# Patient Record
Sex: Male | Born: 1985 | Race: White | Hispanic: No | Marital: Married | Smoking: Former smoker
Health system: Southern US, Community
[De-identification: ages and names within clinical notes are randomized; demographics above are authoritative.]

## PROBLEM LIST (undated history)

## (undated) ENCOUNTER — Emergency Department (HOSPITAL_COMMUNITY): Payer: Self-pay

## (undated) DIAGNOSIS — R569 Unspecified convulsions: Secondary | ICD-10-CM

## (undated) DIAGNOSIS — F1010 Alcohol abuse, uncomplicated: Secondary | ICD-10-CM

## (undated) HISTORY — PX: APPENDECTOMY: SHX54

## (undated) HISTORY — PX: EAR TUBE REMOVAL: SHX1486

## (undated) HISTORY — PX: OTHER SURGICAL HISTORY: SHX169

## (undated) HISTORY — PX: TYMPANOSTOMY TUBE PLACEMENT: SHX32

---
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
## 2004-08-02 ENCOUNTER — Emergency Department (HOSPITAL_COMMUNITY): Admission: EM | Admit: 2004-08-02 | Discharge: 2004-08-02 | Payer: Self-pay | Admitting: Emergency Medicine

## 2004-10-07 ENCOUNTER — Emergency Department (HOSPITAL_COMMUNITY): Admission: EM | Admit: 2004-10-07 | Discharge: 2004-10-07 | Payer: Self-pay | Admitting: *Deleted

## 2005-12-12 ENCOUNTER — Emergency Department (HOSPITAL_COMMUNITY): Admission: EM | Admit: 2005-12-12 | Discharge: 2005-12-12 | Payer: Self-pay | Admitting: Emergency Medicine

## 2006-02-19 ENCOUNTER — Emergency Department (HOSPITAL_COMMUNITY): Admission: EM | Admit: 2006-02-19 | Discharge: 2006-02-19 | Payer: Self-pay | Admitting: Family Medicine

## 2006-06-06 ENCOUNTER — Emergency Department (HOSPITAL_COMMUNITY): Admission: EM | Admit: 2006-06-06 | Discharge: 2006-06-06 | Payer: Self-pay | Admitting: Emergency Medicine

## 2006-07-01 ENCOUNTER — Emergency Department (HOSPITAL_COMMUNITY): Admission: EM | Admit: 2006-07-01 | Discharge: 2006-07-01 | Payer: Self-pay | Admitting: Family Medicine

## 2007-02-25 ENCOUNTER — Other Ambulatory Visit: Payer: Self-pay | Admitting: Emergency Medicine

## 2007-02-25 ENCOUNTER — Inpatient Hospital Stay (HOSPITAL_COMMUNITY): Admission: EM | Admit: 2007-02-25 | Discharge: 2007-02-28 | Payer: Self-pay | Admitting: Psychiatry

## 2007-02-25 ENCOUNTER — Ambulatory Visit: Payer: Self-pay | Admitting: Psychiatry

## 2007-03-03 ENCOUNTER — Other Ambulatory Visit (HOSPITAL_COMMUNITY): Admission: RE | Admit: 2007-03-03 | Discharge: 2007-03-24 | Payer: Self-pay | Admitting: Psychiatry

## 2007-09-09 ENCOUNTER — Emergency Department (HOSPITAL_COMMUNITY): Admission: EM | Admit: 2007-09-09 | Discharge: 2007-09-09 | Payer: Self-pay | Admitting: Emergency Medicine

## 2007-09-11 ENCOUNTER — Emergency Department (HOSPITAL_COMMUNITY): Admission: EM | Admit: 2007-09-11 | Discharge: 2007-09-12 | Payer: Self-pay | Admitting: *Deleted

## 2007-09-14 ENCOUNTER — Emergency Department (HOSPITAL_COMMUNITY): Admission: EM | Admit: 2007-09-14 | Discharge: 2007-09-14 | Payer: Self-pay | Admitting: Emergency Medicine

## 2008-07-02 ENCOUNTER — Emergency Department (HOSPITAL_COMMUNITY): Admission: EM | Admit: 2008-07-02 | Discharge: 2008-07-03 | Payer: Self-pay | Admitting: Emergency Medicine

## 2010-05-04 ENCOUNTER — Emergency Department (HOSPITAL_COMMUNITY)
Admission: EM | Admit: 2010-05-04 | Discharge: 2010-05-05 | Disposition: A | Discharge status: Other Acute Inpatient Hospital | Payer: Medicaid Other | Source: Home / Self Care | Attending: Emergency Medicine | Admitting: Emergency Medicine

## 2010-05-04 DIAGNOSIS — F1010 Alcohol abuse, uncomplicated: Secondary | ICD-10-CM | POA: Insufficient documentation

## 2010-05-04 DIAGNOSIS — F17200 Nicotine dependence, unspecified, uncomplicated: Secondary | ICD-10-CM | POA: Insufficient documentation

## 2010-05-04 LAB — CBC
HCT: 44.1 % (ref 39.0–52.0)
MCV: 100.5 fL — ABNORMAL HIGH (ref 78.0–100.0)
Platelets: 232 10*3/uL (ref 150–400)
RBC: 4.39 MIL/uL (ref 4.22–5.81)
RDW: 13.8 % (ref 11.5–15.5)

## 2010-05-04 LAB — DIFFERENTIAL
Eosinophils Absolute: 0.1 10*3/uL (ref 0.0–0.7)
Lymphocytes Relative: 43 % (ref 12–46)
Lymphs Abs: 3.2 10*3/uL (ref 0.7–4.0)
Monocytes Absolute: 0.6 10*3/uL (ref 0.1–1.0)
Monocytes Relative: 8 % (ref 3–12)
Neutro Abs: 3.4 10*3/uL (ref 1.7–7.7)

## 2010-05-04 LAB — BASIC METABOLIC PANEL
Creatinine, Ser: 0.87 mg/dL (ref 0.4–1.5)
GFR calc Af Amer: >60 mL/min (ref >60)
GFR calc non Af Amer: >60 mL/min (ref >60)
Glucose, Bld: 140 mg/dL — ABNORMAL HIGH (ref 70–99)
Potassium: 3.8 mEq/L (ref 3.5–5.1)

## 2010-05-05 ENCOUNTER — Inpatient Hospital Stay (HOSPITAL_COMMUNITY)
Admission: AD | Admit: 2010-05-05 | Discharge: 2010-05-08 | DRG: 897 | Disposition: A | Discharge status: Home / Self Care | Payer: Medicaid Other | Source: Ambulatory Visit | Attending: Psychiatry | Admitting: Psychiatry

## 2010-05-05 DIAGNOSIS — F10239 Alcohol dependence with withdrawal, unspecified: Secondary | ICD-10-CM

## 2010-05-05 DIAGNOSIS — F1020 Alcohol dependence, uncomplicated: Principal | ICD-10-CM

## 2010-05-05 DIAGNOSIS — F10939 Alcohol use, unspecified with withdrawal, unspecified: Secondary | ICD-10-CM

## 2010-05-05 DIAGNOSIS — Z6379 Other stressful life events affecting family and household: Secondary | ICD-10-CM

## 2010-05-05 LAB — HEPATIC FUNCTION PANEL
Albumin: 4 g/dL (ref 3.5–5.2)
Total Bilirubin: 2.3 mg/dL — ABNORMAL HIGH (ref 0.3–1.2)

## 2010-05-05 LAB — RAPID URINE DRUG SCREEN, HOSP PERFORMED
Amphetamines: NOT DETECTED
Opiates: NOT DETECTED

## 2010-05-06 DIAGNOSIS — F1020 Alcohol dependence, uncomplicated: Secondary | ICD-10-CM

## 2010-05-08 NOTE — H&P (Signed)
NAME:  Peter Waller, CASHER NO.:  [id number]  MEDICAL RECORD NO.:  [id number]           PATIENT TYPE:  I  LOCATION:  0303                          FACILITY:  BH  PHYSICIAN:  Anselm Jungling, MD  DATE OF BIRTH:  1985-09-20  DATE OF ADMISSION:  05/05/2010 DATE OF DISCHARGE:                      PSYCHIATRIC ADMISSION ASSESSMENT   HISTORY:  This is a voluntary admission to the services of Dr. [name].  This 25 year old married white male presented to the [facility] ED.  He requested alcohol detox and stated that his last drink had been earlier in the evening.  He did measure 207.  He states that he has been a heavy drinker for a number of years.  He states that after he was with [place] back in January to February 2009, he stayed clean for 6 months. He is currently unemployed.  He denies being depressed.  He has no issues with suicidal or homicidal ideation.  He has had no serious complications from alcohol withdrawal, but when he tries to stop on his own, he feels sick and he does have a hand tremor.  He denies any outright withdrawal seizures or DTs.  PAST PSYCHIATRIC HISTORY:  As stated, he was with [place] February 24, 2010 to February 27, 2010 and then March 03, 2007 to March 24, 2007.  He states that he was able to stay clean for 6 months after that and then he  relapsed.  He has been treated as an outpatient at the [facility], but has not been there in awhile.  He states he does best when he is in [place].  SOCIAL HISTORY:  He went to the ninth grade.  He never received his GED. He has been married 2 years.  They have a 16-month-old son.  His wife is currently 4 months pregnant.  He receives $658 a month SSI.  He reports that he was in the slow learner's group in school.  FAMILY HISTORY:  His paternal grandfather is alcoholic, actually the whole paternal side of his family drinks.  ALCOHOL/DRUG HISTORY:  He reports that he has been drinking a case of beer daily  since he relapsed.  He began using alcohol at age 25.  PRIMARY CARE PROVIDER:  He does not have one.  MEDICAL PROBLEMS:  He has no known medical illnesses.  He is not prescribed any medications and he denies any drug allergies.  PHYSICAL EXAMINATION:  GENERAL:  He was medically cleared in the ED at [facility]. VITAL SIGNS:  He was afebrile 98.3, pulse was 86, respirations 16, blood pressure was 133/83.  LABORATORY DATA:  His alcohol level was 207.  Other than that, he had no remarkable findings.  His liver functions, however, were not checked. He does have slight hand tremor today.  MENTAL STATUS EXAM:  He is alert and oriented.  He is appropriately groomed, dressed and nourished in his own clothing.  His speech is a little bit slow, but this may be secondary to his educational and IQ achievements.  His mood is all right.  He denies any active depression. Thought processes are clear, rational and goal oriented.  He wants to get clean and sober while his infant son will have no memory of this. Judgment and insight are fair.  Concentration and memory are intact. Intelligence is average to below.  He denies being suicidal or homicidal.  He denies auditory or visual hallucinations.  DIAGNOSES:  AXIS I:  Alcohol dependence, here for withdrawal. AXIS II:  Deferred. AXIS III:  None known. AXIS IV:  Economic issues. AXIS V:  48.  PLAN:  The plan is to admit for medically supported alcohol withdrawal through use of the Librium detox protocol.  When the patient was with [place] back in 2009, he was discharged on Campral.  He does not remember it being very effective.  He wants to be on as few meds as possible.  We will discuss the need for any other medications with Dr. Huston Foley later today.  Estimated length of stay is 3-5 days.     Mickie Leonarda Salon, P.A.-C.   ______________________________ Anselm Jungling, MD    MD/MEDQ  D:  05/06/2010  T:  05/06/2010  Job:   [id number]  Electronically Signed by Jaci Lazier ADAMS P.A.-C. on 05/07/2010 12:50:48 PM Electronically Signed by [name] MD on 05/08/2010 11:29:28 AM

## 2010-05-10 NOTE — Discharge Summary (Signed)
  NAME:  Peter Waller, Peter Waller NO.:  [id number]  MEDICAL RECORD NO.:  [id number]           PATIENT TYPE:  I  LOCATION:  0303                          FACILITY:  BH  PHYSICIAN:  Anselm Jungling, MD  DATE OF BIRTH:  [birth year]  DATE OF ADMISSION:  05/05/2010 DATE OF DISCHARGE:  05/08/2010                              DISCHARGE SUMMARY   IDENTIFYING DATA AND REASON FOR ADMISSION:  This was an inpatient psychiatric admission for Peter Waller, a 25 year old male, who presented for treatment of alcohol dependence.  This was his second [facility] admission.  Please refer to the Admission Note for further details pertaining to the symptoms, circumstances and history that led to his hospitalization.  He was given an initial Axis I diagnosis of alcohol dependence.  MEDICAL AND LABORATORY:  The patient was medically and physically assessed by the psychiatric nurse practitioner.  He was in good health without any active or chronic medical problems.  There were no significant medical issues outside of his alcohol detoxification.  HOSPITAL COURSE:  The patient was admitted to the Adult Inpatient Psychiatric Service.  He presented as a well-nourished, normally- developed adult male who was pleasant and cooperative.  He reported that he had previously been attending Alcoholics Anonymous, but had never gotten a sponsor.  He had not been working, but was married with a young child.  He expressed a strong desire for long-term sobriety and recovery.  He participated in therapeutic groups and activities, including those geared towards 12-step recovery.  He was a good participant throughout.  He was absent of suicidal ideation at all times during his stay.  At the time of discharge, the suicide risk assessment was completed, with a finding of minimal risk.  He appeared appropriate for discharge on the fourth hospital day.  He agreed to the following aftercare  plan.  AFTERCARE:  The patient declined any referrals to mental health or substance abuse treatment programs in the community.  However, he committed to attending 90 meetings in 90 days, and getting an AA sponsor.  He was discharged with two doses of Librium 25 to be taken at bedtime x2 nights, to complete his detoxification protocol.  DISCHARGE DIAGNOSES:  AXIS I:  Alcohol dependence, early remission. AXIS II:  Deferred. AXIS III:  No acute or chronic illnesses. AXIS IV:  Stressors, severe. AXIS V:  Global Assessment of Functioning on discharge 50.     Anselm Jungling, MD     SPB/MEDQ  D:  05/09/2010  T:  05/09/2010  Job:  [id number]  Electronically Signed by [name] MD on 05/10/2010 10:00:06 AM

## 2010-05-30 LAB — POCT I-STAT, CHEM 8
Glucose, Bld: 135 mg/dL — ABNORMAL HIGH (ref 70–99)
HCT: 51 % (ref 39.0–52.0)
Sodium: 137 mEq/L (ref 135–145)
TCO2: 29 mmol/L (ref 0–100)

## 2010-05-30 LAB — DIFFERENTIAL
Eosinophils Absolute: 0.1 10*3/uL (ref 0.0–0.7)
Eosinophils Relative: 1 % (ref 0–5)
Lymphocytes Relative: 29 % (ref 12–46)
Lymphs Abs: 2.2 10*3/uL (ref 0.7–4.0)
Monocytes Absolute: 0.7 10*3/uL (ref 0.1–1.0)
Monocytes Relative: 9 % (ref 3–12)

## 2010-05-30 LAB — URINALYSIS, ROUTINE W REFLEX MICROSCOPIC
Bilirubin Urine: NEGATIVE
Glucose, UA: NEGATIVE mg/dL
Ketones, ur: NEGATIVE mg/dL
Protein, ur: NEGATIVE mg/dL
Specific Gravity, Urine: 1.009 (ref 1.005–1.030)
pH: 6.5 (ref 5.0–8.0)

## 2010-05-30 LAB — CBC
HCT: 45.3 % (ref 39.0–52.0)
Platelets: 188 10*3/uL (ref 150–400)
RBC: 4.38 MIL/uL (ref 4.22–5.81)
RDW: 14 % (ref 11.5–15.5)

## 2010-05-30 LAB — RAPID URINE DRUG SCREEN, HOSP PERFORMED: Cocaine: NOT DETECTED

## 2010-07-04 NOTE — Discharge Summary (Signed)
NAME:  Peter Waller, Peter Waller NO.:  [id number]   MEDICAL RECORD NO.:  [id number]          PATIENT TYPE:  IPS   LOCATION:  0507                          FACILITY:  BH   PHYSICIAN:  Geoffery Lyons, M.D.      DATE OF BIRTH:  08/21/1985   DATE OF ADMISSION:  02/25/2007  DATE OF DISCHARGE:  02/28/2007                               DISCHARGE SUMMARY   CHIEF COMPLAINT AND PRESENT ILLNESS:  This was the first admission to  [facility] for this 25 year old male voluntarily  admitted.  History of alcohol abuse, drinking 1-2 cases of beer per day.  Endorsed that he started partying when he was 13, a couple of beers  when he was 15, started partying all the time by 17.  He was drinking a  case plus a day, got sick in the morning, started drinking early on.  Occasional use of oxy.  When he was 13 he tried cocaine, making him  paranoid, occasional use of marijuana.  Endorsed that he occasionally  feels depressed, wanting to die, was committed.  He was wanting to be  detoxed, was feeling very overwhelmed with the way things were going for  him.   PAST PSYCHIATRIC HISTORY:  First time at behavioral health.  No ongoing  treatment.  Has been deemed disabled.  Was assessed for learning  disabilities.   ALCOHOL AND DRUG HISTORY:  As already stated, started using alcohol and  built up to 2 cases per day with early morning drinking and withdrawal.  He is now drinking every day.  Occasional use of oxy and marijuana.  No  active use of cocaine.   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   SOCIAL HISTORY:  Endorsed that he grew up fighting due to the kids  picking on him due to the growth he had.  Then he developed deafness.  Did not do well in school, had to fight a lot, quit school when he was  in 10th grade.  Biological father on his side were afflicted by  alcoholism, stepfather also used to  drink, so he grew up around alcohol.  His siblings, alcohol and other addictions, jail.  He endorsed depressed  mood, decreased energy, decreased motivation, a sense of hopelessness  and helplessness.   We continued to detox with Librium.  He was willing to take Campral for  the cravings and we tried some trazodone for sleep.  He continued to be  focused on commitment to abstaining.  He was in a relationship with a  girlfriend who was supportive of him.  She was wanting to be with him  and get pregnant.  She was concerned about any possible effect of  alcohol on the baby if he was to continue to drink.  He was pretty  insightful.  He was committed to abstinence, however at the same time he  was worried about when he got out there he did not want to go back to  drinking as he had to let  go of the things he used to like to do before  like fishing, hunting, who sees how alcohol was taking over his life.  We worked on Pharmacologist.  He was able to open up and talk about some  of the traumatic events in his life.  January 9 he was in full contact  with reality and there were no active suicidal or homicidal ideas, no  hallucinations or delusions.  Willing and motivated to pursue further  outpatient treatment, was going to come to [facility]  I[facility].   DISCHARGE DIAGNOSES:  Axis I:  Alcohol dependence; depressive disorder,  not otherwise specified; rule out posttraumatic stress disorder.  Axis II:  No diagnosis.  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  Axis IV:  Moderate.  Axis V:  Upon discharge 50.   DISCHARGED ON:  1. Campral 333 mg 2-3 times a day.  2. Trazodone 100 mg 1/2 to 1 at night for sleep.  3. Librium 25, 1 in the morning x3 more days, then discontinue.   FOLLOWUP:  [facility] IOP.      Geoffery Lyons, M.D.  Electronically Signed     IL/MEDQ  D:  03/17/2007  T:  03/18/2007  Job:  [id number]

## 2010-07-07 NOTE — Op Note (Signed)
NAME:  Peter Waller, Peter Waller                       ACCOUNT NO.:  [id number]   MEDICAL RECORD NO.:  [id number]                   PATIENT TYPE:  INP   LOCATION:  6123                                 FACILITY:  [facility]   PHYSICIAN:  Leonia Corona, M.D.               DATE OF BIRTH:  01-07-1986   DATE OF PROCEDURE:  11/15/2001  DATE OF DISCHARGE:  11/16/2001                                 OPERATIVE REPORT   IDENTIFYING INFORMATION:  25 year old male child.   PREOPERATIVE DIAGNOSIS:  Acute appendicitis, possible perforation, possibly  perforation peritonitis.   POSTOPERATIVE DIAGNOSIS:  Acute suppurative appendicitis.   OPERATION/PROCEDURE:  Open appendectomy.   SURGEON:  Leonia Corona, M.D.   ASSISTANT:  Nurse.   ANESTHESIA:  General endotracheal anesthesia.   DESCRIPTION OF PROCEDURE:  The patient was brought into the operating room,  placed upon the operating table and general endotracheal anesthesia was  given.  The right lower quadrant of the abdominal wall and the surrounding  area is cleaned, prepped and draped in the usual manner.  A transverse  incision at the center of McBurney's point, measuring about 4-5 cm, is made  with a knife.  The incision is deepened into the subcutaneous tissue using  electrocautery and then the external aponeurosis was exposed.  Approximately  10 cc of 0.25% Marcaine with epinephrine was infiltrated into the  subcutaneous plane for postoperative pain control.  The extremities  aponeurosis was incised in the line of its fibers and then internal oblique  and transverse abdominis muscle then split along its fibers with the help of  a blunted hemostat and stretching with the blade of the retractor inserted  between the fibers until the peritoneum was exposed.  The peritoneum was  held up with push hemostats and incised in between as straw-colored fluid  exuded which was suctioned and cultures were obtained.  Aerobic and  anaerobic cultures were  obtained.  The opening in the peritoneum was  extended with the help of scissors.  The blade of the retractor was inserted  and the cecum was identified right beneath the incision with the help of  tenia coli.  It was held up with simple Babcock forceps and a very large,  tense, turgid appendix was immediately delivered without any difficulty.  It  had a large fecalith palpable.  It was tense and turgid with a very thick  remnant of mesoappendix.  The vessels running in the mesoappendix were  divided between clamps and ligated using 2-0 silk until the base of the  appendix was cleared.  The base of the appendix was inverted, clamped above.  It was transfix ligated using 2-0 Vicryl.  The appendix was divided below  the plane and above the ligator and removed from the field.  A pursestring  suture using 3-0 silk was made around the base of the appendix and it was  tied after inverting the base.  The  cecum was returned back into the  peritoneum cavity.  The anterior of the peritoneum was inspected for any  oozing or bleeding.  None was noted.  Thorough irrigation of the peritoneal  cavity with copious amounts of warm saline was done until a return of clear.  We now closed the peritoneum using 2-0 Vicryl running stitch.  The internal  transversus abdominis muscles were approximated using interrupted sutures  with 2-0 Vicryl.  The external oblique aponeurosis was repaired using 2-0  Vicryl interrupted sutures.  The wound was irrigated once again.  The skin  was closed in two layers, the subcutaneous using 4-0 Vicryl interrupted  sutures and the skin with 5-0 Vicryl subcuticular stitch.  Steri-  Strips were applied which were covered with sterile gauze and Tegaderm  dressing.  The patient tolerated the procedure very well which was smooth  and uneventful.  The patient was extubated and transported to the recovery  room in good and stable condition.                                                Leonia Corona, M.D.    SF/MEDQ  D:  11/15/2001  T:  11/17/2001  Job:  [id number]

## 2010-11-08 LAB — COMPREHENSIVE METABOLIC PANEL
Alkaline Phosphatase: 58
BUN: 6
Creatinine, Ser: 1.12
Potassium: 3.6
Sodium: 143
Total Protein: 8.2

## 2010-11-08 LAB — ETHANOL
Alcohol, Ethyl (B): 170 — ABNORMAL HIGH
Alcohol, Ethyl (B): 302 — ABNORMAL HIGH

## 2010-11-08 LAB — DIFFERENTIAL
Eosinophils Absolute: 0.1
Monocytes Absolute: 0.3
Neutro Abs: 4.5

## 2010-11-08 LAB — CBC
HCT: 47.5
MCHC: 34.9
Platelets: 299
RBC: 4.83
RDW: 12.4
WBC: 8.1

## 2010-11-08 LAB — RAPID URINE DRUG SCREEN, HOSP PERFORMED: Cocaine: NOT DETECTED

## 2010-11-09 LAB — URINE DRUGS OF ABUSE SCREEN W ALC, ROUTINE (REF LAB)
Amphetamine Screen, Ur: NEGATIVE
Cocaine Metabolites: NEGATIVE
Ethyl Alcohol: <5
Marijuana Metabolite: NEGATIVE
Methadone: NEGATIVE

## 2010-12-14 ENCOUNTER — Emergency Department (HOSPITAL_COMMUNITY)
Admission: EM | Admit: 2010-12-14 | Discharge: 2010-12-14 | Disposition: A | Discharge status: Home / Self Care | Payer: Medicaid Other | Attending: Emergency Medicine | Admitting: Emergency Medicine

## 2010-12-14 DIAGNOSIS — K5289 Other specified noninfective gastroenteritis and colitis: Secondary | ICD-10-CM | POA: Insufficient documentation

## 2010-12-14 DIAGNOSIS — R42 Dizziness and giddiness: Secondary | ICD-10-CM | POA: Insufficient documentation

## 2010-12-14 DIAGNOSIS — Z9889 Other specified postprocedural states: Secondary | ICD-10-CM | POA: Insufficient documentation

## 2010-12-14 DIAGNOSIS — R112 Nausea with vomiting, unspecified: Secondary | ICD-10-CM | POA: Insufficient documentation

## 2010-12-29 ENCOUNTER — Encounter: Payer: Self-pay | Admitting: Emergency Medicine

## 2010-12-29 ENCOUNTER — Emergency Department (HOSPITAL_COMMUNITY)
Admission: EM | Admit: 2010-12-29 | Discharge: 2010-12-29 | Discharge status: Against Medical Advice | Payer: Medicaid Other | Attending: Emergency Medicine | Admitting: Emergency Medicine

## 2010-12-29 ENCOUNTER — Inpatient Hospital Stay (HOSPITAL_COMMUNITY): Admission: AD | Admit: 2010-12-29 | Payer: Medicaid Other | Source: Ambulatory Visit | Admitting: Psychiatry

## 2010-12-29 DIAGNOSIS — F1010 Alcohol abuse, uncomplicated: Secondary | ICD-10-CM | POA: Insufficient documentation

## 2010-12-29 LAB — CBC
MCHC: 34.6 g/dL (ref 30.0–36.0)
Platelets: 184 10*3/uL (ref 150–400)
RDW: 14 % (ref 11.5–15.5)

## 2010-12-29 LAB — RAPID URINE DRUG SCREEN, HOSP PERFORMED
Barbiturates: NOT DETECTED
Cocaine: NOT DETECTED
Opiates: POSITIVE — AB

## 2010-12-29 LAB — COMPREHENSIVE METABOLIC PANEL
AST: 57 U/L — ABNORMAL HIGH (ref 0–37)
BUN: 7 mg/dL (ref 6–23)
CO2: 25 mEq/L (ref 19–32)
Chloride: 105 mEq/L (ref 96–112)
Creatinine, Ser: 0.75 mg/dL (ref 0.50–1.35)
GFR calc non Af Amer: >90 mL/min (ref >90)
Glucose, Bld: 70 mg/dL (ref 70–99)
Total Bilirubin: 0.3 mg/dL (ref 0.3–1.2)

## 2010-12-29 LAB — ETHANOL: Alcohol, Ethyl (B): 315 mg/dL — ABNORMAL HIGH (ref 0–11)

## 2010-12-29 MED ORDER — IBUPROFEN 200 MG PO TABS: 600.0000 mg | ORAL_TABLET | Freq: Three times a day (TID) | ORAL | Status: DC | PRN

## 2010-12-29 MED ORDER — LORAZEPAM 1 MG PO TABS: 1.0000 mg | ORAL_TABLET | Freq: Three times a day (TID) | ORAL | Status: DC | PRN

## 2010-12-29 MED ORDER — ACETAMINOPHEN 325 MG PO TABS: 650.0000 mg | ORAL_TABLET | ORAL | Status: DC | PRN

## 2010-12-29 MED ORDER — ONDANSETRON HCL 8 MG PO TABS: 4.0000 mg | ORAL_TABLET | Freq: Three times a day (TID) | ORAL | Status: DC | PRN

## 2010-12-29 NOTE — Progress Notes (Addendum)
This patient was accepted for inpatient treatment to Dr. Dan Humphreys at 1400 to bed 304.02.  All paperwork was completed and faxed to appropriate parties.  Dr. And nursing staff were notified and agreeable with transfer.  ITR faxed to [facility] since writer currently does not have access to [facility] webpage.     Wt Readings from Last 3 Encounters:  No data found for Wt   Temp Readings from Last 3 Encounters:  12/29/10 97.5 F (36.4 C) Oral   BP Readings from Last 3 Encounters:  12/29/10 103/66   Pulse Readings from Last 3 Encounters:  12/29/10 89

## 2010-12-29 NOTE — ED Notes (Signed)
Attempted to have pt wake up to void, unable to wake pt up, pt was unable to become aroused, pt opened eyes but will not keep eyes open.

## 2010-12-29 NOTE — ED Notes (Signed)
Patient resting,  ACT team at bedside.  Patient denies any s/sx of DT.  He was sleeping prior to ACT arrival

## 2010-12-29 NOTE — ED Notes (Signed)
PT. REQUESTING DETOX FOR ETOH ABUSE , LAST DRINK THIS EVENING , DENIES SUICIDAL IDEATION.

## 2010-12-29 NOTE — Progress Notes (Signed)
Spiritual Care  Initiated conversation with patient.  Patient is grieving many losses including, most recently, the loss of his 16 month old daughter.  Patient has a great deal of emotional pain and anger.  I provided emotional support and grief support for patient.  He has come voluntarily to do rehab because he wants a different kind of life for him and for his family.  Peter Waller [phone]

## 2010-12-29 NOTE — ED Provider Notes (Signed)
History     CSN: [id number] Arrival date & time: 12/29/2010  1:18 AM   First MD Initiated Contact with Patient 12/29/10 0129      Chief Complaint  Patient presents with  . Alcohol Intoxication    (Consider location/radiation/quality/duration/timing/severity/associated sxs/prior treatment) Patient is a 25 y.o. male presenting with intoxication. The history is provided by the patient. No language interpreter was used.  Alcohol Intoxication This is a recurrent problem. The current episode started 6 to 12 hours ago. The problem occurs constantly. The problem has not changed since onset.Pertinent negatives include no chest pain, no abdominal pain, no headaches and no shortness of breath. The symptoms are aggravated by nothing. The symptoms are relieved by nothing. He has tried nothing for the symptoms. The treatment provided no relief.  Drank several 40s last night and presents wanting detox.  No SI or HI no AH or VH  History reviewed. No pertinent past medical history.  History reviewed. No pertinent past surgical history.  No family history on file.  History  Substance Use Topics  . Smoking status: Current Everyday Smoker  . Smokeless tobacco: Not on file  . Alcohol Use: Yes     REQUESTING DETOX FOR ALCOHOL ABUSE DRINKS DAILY      Review of Systems  Constitutional: Negative for activity change.  HENT: Negative for facial swelling.   Eyes: Negative for discharge.  Respiratory: Negative for shortness of breath.   Cardiovascular: Negative for chest pain.  Gastrointestinal: Negative for abdominal pain.  Genitourinary: Negative for difficulty urinating.  Musculoskeletal: Negative for arthralgias.  Neurological: Negative for dizziness and headaches.  Hematological: Negative.   Psychiatric/Behavioral: Negative for hallucinations, confusion, dysphoric mood and agitation.    Allergies  Aloe  Home Medications  No current outpatient prescriptions on file.  BP 131/81  Pulse  119  Temp(Src) 98.8 F (37.1 C) (Oral)  Resp 20  SpO2 96%  Physical Exam  Constitutional: He is oriented to person, place, and time. He appears well-developed and well-nourished. No distress.  HENT:  Head: Normocephalic and atraumatic.  Mouth/Throat: Oropharynx is clear and moist.  Eyes: EOM are normal. Pupils are equal, round, and reactive to light. Right eye exhibits no discharge. Left eye exhibits no discharge.  Neck: Normal range of motion. Neck supple.  Cardiovascular: Normal rate and regular rhythm.   Pulmonary/Chest: Effort normal and breath sounds normal.  Abdominal: Soft. Bowel sounds are normal.  Musculoskeletal: Normal range of motion. He exhibits no edema.  Neurological: He is alert and oriented to person, place, and time. He displays normal reflexes.  Skin: Skin is warm and dry.  Psychiatric: He has a normal mood and affect.    ED Course  Procedures (including critical care time)   Labs Reviewed  ETHANOL  CBC  URINE RAPID DRUG SCREEN (HOSP PERFORMED)  COMPREHENSIVE METABOLIC PANEL   No results found.   No diagnosis found.    MDM  Awaiting labs will consult ACT when appropriate        Kamerin Grumbine K Dynasty Holquin-Rasch, MD 12/29/10 1478

## 2010-12-29 NOTE — Progress Notes (Signed)
Assessment Note   Peter Waller is an 25 y.o. male that presented to the emergency department intoxicated with a BAL or .315.  A new BAL has been ordered.  Pt has had previous treatment for same.  Pt denies SI, HI, or any active psychosis.  Pt is concerned about his court date on the 13th of November and reports that he would need to be discharged prior to then.  Pt is refusing treatment at [facility] "because it was not helpful or useful" for me and is requesting to be inpt at [facility].  Please run for possible inpatient detox.  Axis I: Alcohol Abuse Axis II: Narcissistic Personality traits Axis III: History reviewed. No pertinent past medical history. Axis IV: other psychosocial or environmental problems, problems related to legal system/crime, problems related to social environment and problems with primary support group Axis V: 31-40 impairment in reality testing  Past Medical History: History reviewed. No pertinent past medical history.  History reviewed. No pertinent past surgical history.  Family History: No family history on file.  Social History:  reports that he has been smoking.  He does not have any smokeless tobacco history on file. He reports that he drinks alcohol. His drug history not on file.  Allergies:  Allergies  Allergen Reactions  . Aloe Rash    Home Medications:  Medications Prior to Admission  Medication Dose Route Frequency Provider Last Rate Last Dose  . acetaminophen (TYLENOL) tablet 650 mg  650 mg Oral Q4H PRN April K Palumbo-Rasch, MD      . ibuprofen (ADVIL,MOTRIN) tablet 600 mg  600 mg Oral Q8H PRN April K Palumbo-Rasch, MD      . LORazepam (ATIVAN) tablet 1 mg  1 mg Oral Q8H PRN April K Palumbo-Rasch, MD      . ondansetron [facility]) tablet 4 mg  4 mg Oral Q8H PRN April K Palumbo-Rasch, MD       No current outpatient prescriptions on file as of 12/29/2010.    OB/GYN Status:  No LMP for male patient.  General Assessment Data Living Arrangements:  Relatives Can pt return to current living arrangement?: Yes Admission Status: Voluntary Is patient capable of signing voluntary admission?: Yes Transfer from: Acute Hospital Referral Source: MD  Risk to self Suicidal Ideation: No Suicidal Intent: No Is patient at risk for suicide?: No Suicidal Plan?: No Access to Means: No What has been your use of drugs/alcohol within the last 12 months?: daily Factors that decrease suicide risk: Sense of responsibility to family Family Suicide History: No Recent stressful life event(s): Loss (Comment);Other (Comment) (just lost daughter) Persecutory voices/beliefs?: No Depression: No Depression Symptoms: Feeling worthless/self pity Substance abuse history and/or treatment for substance abuse?: Yes Suicide prevention information given to non-admitted patients: Yes  Risk to Others Homicidal Ideation: No Thoughts of Harm to Others: No Current Homicidal Intent: No Current Homicidal Plan: No Access to Homicidal Means: No History of harm to others?: Yes Assessment of Violence: In distant past Does patient have access to weapons?: Yes (Comment) Criminal Charges Pending?: Yes Describe Pending Criminal Charges: won't divulge Does patient have a court date: Yes Court Date: 01/02/11  Mental Status Report Appear/Hygiene: Disheveled Eye Contact: Good Motor Activity: Unremarkable Speech: Loud Level of Consciousness: Drowsy Mood: Labile Affect: Labile Anxiety Level: Minimal Thought Processes: Relevant Judgement: Impaired Orientation: Person;Place;Situation Obsessive Compulsive Thoughts/Behaviors: Moderate  Cognitive Functioning Concentration: Decreased Memory: Recent Impaired;Remote Intact IQ: Average Insight: Poor Impulse Control: Poor Appetite: Fair Sleep: No Change Total Hours of Sleep: 5  Vegetative Symptoms: None  Prior Inpatient/Outpatient Therapy Prior Therapy: Outpatient Prior Therapy Dates: currently Prior Therapy  Facilty/Provider(s): [facility] Reason for Treatment: detox                     Additional Information 1:1 In Past 12 Months?: No CIRT Risk: No Elopement Risk: No Does patient have medical clearance?: Yes     Disposition:  Disposition Disposition of Patient: Referred to [facility] for inpatient detox) Patient referred to:  [facility])  On Site Evaluation by:   Reviewed with Physician:     Angelica Ran 12/29/2010 9:28 AM

## 2010-12-29 NOTE — ED Notes (Signed)
Patient here for request for detox from Alcohol.  Patient states that he drinks several 40's per day.  Patient's last intake of alcohol was this evening.  Patient is having some problems staying awake at this time, unsteady gait.

## 2011-03-11 ENCOUNTER — Emergency Department (HOSPITAL_COMMUNITY)
Admission: EM | Admit: 2011-03-11 | Discharge: 2011-03-11 | Disposition: A | Discharge status: Home / Self Care | Payer: Medicaid Other | Attending: Emergency Medicine | Admitting: Emergency Medicine

## 2011-03-11 ENCOUNTER — Encounter (HOSPITAL_COMMUNITY): Payer: Self-pay | Admitting: Emergency Medicine

## 2011-03-11 DIAGNOSIS — F17200 Nicotine dependence, unspecified, uncomplicated: Secondary | ICD-10-CM | POA: Insufficient documentation

## 2011-03-11 DIAGNOSIS — B86 Scabies: Secondary | ICD-10-CM | POA: Insufficient documentation

## 2011-03-11 MED ORDER — PERMETHRIN 5 % EX CREA: TOPICAL_CREAM | CUTANEOUS | Status: AC

## 2011-03-11 NOTE — ED Notes (Signed)
Pt reports rash bilat legs onsets 2-3 days ago denies contact with any new substance prior to onset

## 2011-03-12 NOTE — ED Provider Notes (Signed)
Medical screening examination/treatment/procedure(s) were performed by non-physician practitioner and as supervising physician I was immediately available for consultation/collaboration.  Martha K Linker, MD 03/12/11 0135 

## 2011-03-12 NOTE — ED Provider Notes (Signed)
History     CSN: [id number]  Arrival date & time 03/11/11  2254   First MD Initiated Contact with Patient 03/11/11 2323      Chief Complaint  Patient presents with  . Rash    bilat legs    (Consider location/radiation/quality/duration/timing/severity/associated sxs/prior treatment) HPI Comments: Patient here with two other family members with complaints of itchy rash to groin, abdomen around waist band and upper thighs - states other members have the same thing.  Patient is a 26 y.o. male presenting with rash. The history is provided by the patient. No language interpreter was used.  Rash  This is a new problem. The current episode started 2 days ago. The problem has not changed since onset.The problem is associated with an unknown factor. There has been no fever. The rash is present on the torso, back, abdomen, groin, right upper leg and left upper leg. The pain is at a severity of 0/10. The patient is experiencing no pain. The pain has been constant since onset. Associated symptoms include itching. He has tried nothing for the symptoms.    History reviewed. No pertinent past medical history.  Past Surgical History  Procedure Date  . Ear tube removal     No family history on file.  History  Substance Use Topics  . Smoking status: Current Everyday Smoker  . Smokeless tobacco: Not on file  . Alcohol Use: Yes     REQUESTING DETOX FOR ALCOHOL ABUSE DRINKS DAILY      Review of Systems  Skin: Positive for itching and rash.  All other systems reviewed and are negative.    Allergies  Aloe  Home Medications   Current Outpatient Rx  Name Route Sig Dispense Refill  . PERMETHRIN 5 % EX CREA  Apply to affected area once - may re-apply in 7 days if needed 60 g 0    BP 127/81  Pulse 75  Temp 98.1 F (36.7 C)  Resp 18  SpO2 99%  Physical Exam  Nursing note and vitals reviewed. Constitutional: He is oriented to person, place, and time. He appears well-developed and  well-nourished. No distress.  HENT:  Head: Normocephalic and atraumatic.  Nose: Nose normal.  Mouth/Throat: Oropharynx is clear and moist. No oropharyngeal exudate.  Eyes: Conjunctivae are normal. Pupils are equal, round, and reactive to light. No scleral icterus.  Neck: Normal range of motion. Neck supple.  Cardiovascular: Normal rate, regular rhythm and normal heart sounds.  Exam reveals no gallop and no friction rub.   No murmur heard. Pulmonary/Chest: Effort normal and breath sounds normal. No respiratory distress.  Abdominal: Soft. Bowel sounds are normal. There is no tenderness.  Musculoskeletal: He exhibits no edema and no tenderness.  Lymphadenopathy:    He has no cervical adenopathy.  Neurological: He is alert and oriented to person, place, and time. No cranial nerve deficit.  Skin: Skin is warm and dry. Rash noted.       Fine papular rash noted around abdomen, groin creases and upper thighs.    ED Course  Procedures (including critical care time)  Labs Reviewed - No data to display No results found.   1. Scabies       MDM  Scabies type rash with intense itching - elmite prescribed        Scarlette Calico C. [place], [place] 03/12/11 [phone]

## 2011-07-29 ENCOUNTER — Emergency Department (HOSPITAL_COMMUNITY)
Admission: EM | Admit: 2011-07-29 | Discharge: 2011-07-29 | Disposition: A | Discharge status: Home / Self Care | Payer: Medicaid Other | Attending: Emergency Medicine | Admitting: Emergency Medicine

## 2011-07-29 ENCOUNTER — Emergency Department (HOSPITAL_COMMUNITY): Payer: Medicaid Other

## 2011-07-29 ENCOUNTER — Encounter (HOSPITAL_COMMUNITY): Payer: Self-pay | Admitting: *Deleted

## 2011-07-29 DIAGNOSIS — F17200 Nicotine dependence, unspecified, uncomplicated: Secondary | ICD-10-CM | POA: Insufficient documentation

## 2011-07-29 DIAGNOSIS — IMO0002 Reserved for concepts with insufficient information to code with codable children: Secondary | ICD-10-CM | POA: Insufficient documentation

## 2011-07-29 DIAGNOSIS — S60219A Contusion of unspecified wrist, initial encounter: Secondary | ICD-10-CM | POA: Insufficient documentation

## 2011-07-29 MED ORDER — HYDROCODONE-ACETAMINOPHEN 5-325 MG PO TABS
1.0000 | ORAL_TABLET | Freq: Once | ORAL | Status: AC
  Administered 2011-07-29: 1 via ORAL
  Filled 2011-07-29: qty 1

## 2011-07-29 MED ORDER — HYDROCODONE-ACETAMINOPHEN 5-325 MG PO TABS: 2.0000 | ORAL_TABLET | ORAL | Status: AC | PRN

## 2011-07-29 NOTE — ED Provider Notes (Signed)
History   This chart was scribed for Flint Melter, MD scribed by Magnus Sinning. The patient was seen in room STRE5/STRE5 seen at 19:18.   CSN: [id number]  Arrival date & time 07/29/11  1847   None     Chief Complaint  Patient presents with  . Hand Injury    (Consider location/radiation/quality/duration/timing/severity/associated sxs/prior treatment) HPI Peter Waller is a 26 y.o. male who presents to the Emergency Department complaining of persistent moderate left hand pain concentrated at his left wrist, resulting from an injury occurring today. States a window was closed onto his left hand and that he has been having pain since. There are no modifying or aggravating factors.  History reviewed. No pertinent past medical history.  Past Surgical History  Procedure Date  . Ear tube removal     History reviewed. No pertinent family history.  History  Substance Use Topics  . Smoking status: Current Everyday Smoker  . Smokeless tobacco: Not on file  . Alcohol Use: Yes     REQUESTING DETOX FOR ALCOHOL ABUSE DRINKS DAILY      Review of Systems 10 Systems reviewed and are negative for acute change except as noted in the HPI. Allergies  Aloe  Home Medications   Current Outpatient Rx  Name Route Sig Dispense Refill  . HYDROCODONE-ACETAMINOPHEN 5-325 MG PO TABS Oral Take 2 tablets by mouth every 4 (four) hours as needed for pain. 10 tablet 0    BP 123/73  Pulse 88  Temp(Src) 98.3 F (36.8 C) (Oral)  Resp 16  SpO2 98%  Physical Exam  Nursing note and vitals reviewed. Constitutional: He is oriented to person, place, and time. He appears well-developed and well-nourished. No distress.  HENT:  Head: Normocephalic and atraumatic.  Eyes: Conjunctivae and EOM are normal.  Neck: Neck supple. No tracheal deviation present.  Cardiovascular: Normal rate.   Pulmonary/Chest: Effort normal. No respiratory distress.  Musculoskeletal: Normal range of motion. He exhibits  tenderness.       Left wrist is tender on the volar aspect without swelling. There is moderate pain on passive ROM of the left wrist. Left hand has diffused tenderness w/o swelling or deformity. Decreased active ROM of hand at wrist secondary to pain.  Neurological: He is alert and oriented to person, place, and time.  Skin: Skin is warm and dry.  Psychiatric: He has a normal mood and affect. His behavior is normal.    ED Course  Procedures (including critical care time) DIAGNOSTIC STUDIES: Oxygen Saturation is 98% on room air, normal by my interpretation.    COORDINATION OF CARE:  Labs Reviewed - No data to display Dg Hand Complete Left  07/29/2011  *RADIOLOGY REPORT*  Clinical Data: Left hand pain and swelling following injury yesterday.  Limited finger movement.  LEFT HAND - COMPLETE 3+ VIEW  Comparison: None.  Findings: The mineralization and alignment are normal.  There is no evidence of acute fracture or dislocation.  No focal soft tissue swelling or foreign body is identified.  IMPRESSION: No acute osseous findings.  Original Report Authenticated By: Gerrianne Scale, M.D.     1. Wrist contusion       I personally performed the services described in this documentation, which was scribed in my presence. The recorded information has been reviewed and considered.          Flint Melter, MD 07/29/11 2002

## 2011-07-29 NOTE — ED Notes (Signed)
Splint applied by ortho tech. Pt called wife for ride.

## 2011-07-29 NOTE — Progress Notes (Signed)
Orthopedic Tech Progress Note Patient Details:  Peter Waller 1985-05-21 [id number]  Ortho Devices Type of Ortho Device: Velcro wrist splint Ortho Device/Splint Location: left wrist Ortho Device/Splint Interventions: Application   [name] 07/29/2011, 8:03 PM

## 2011-07-29 NOTE — Discharge Instructions (Signed)
The splint for comfort. See the Dr. of your choice for problems.   Contusion A contusion is a deep bruise. Contusions happen when an injury causes bleeding under the skin. Signs of bruising include pain, puffiness (swelling), and discolored skin. The contusion may turn blue, purple, or yellow. HOME CARE   Put ice on the injured area.   Put ice in a plastic bag.   Place a towel between your skin and the bag.   Leave the ice on for 15 to 20 minutes, 3 to 4 times a day.   Only take medicine as told by your doctor.   Rest the injured area.   If possible, raise (elevate) the injured area to lessen puffiness.  GET HELP RIGHT AWAY IF:   You have more bruising or puffiness.   You have pain that is getting worse.   Your puffiness or pain is not helped by medicine.  MAKE SURE YOU:   Understand these instructions.   Will watch your condition.   Will get help right away if you are not doing well or get worse.  Document Released: 07/25/2007 Document Revised: 01/25/2011 Document Reviewed: 12/11/2010 [facility] Patient Information 2012 [place], [place].  RESOURCE GUIDE  Chronic Pain Problems: Contact [facility]  [phone] Patients need to be referred by their primary care doctor.  Insufficient Money for Medicine: Contact United Way:  call "211" or [facility] [phone].  No Primary Care Doctor: - Call [facility]  [phone] - can help you locate a primary care doctor that  accepts your insurance, provides certain services, etc. - Physician Referral [phone]  Agencies that provide inexpensive medical care: - [facility]  [phone] - [facility]  [phone] - [facility]  [phone] [facility]  [phone] - Planned Parenthood  [phone] [facility]  [phone]  Medicaid-accepting [facility] Providers: - [facility]- [address]  [phone], Mon-Fri 9am-7pm, Sat 9am-1pm - [facility]- [address] [place], [address] [place]  [phone] - [facility]- [address] [place]  [phone] [facility]- [address]  [phone] - Renaye Rakers- [address] [place], [address], [phone]  Only accepts [place] patients after they have their name  applied to their card  Self Pay (no insurance) in [place]: - Sickle Cell Patients: Dr Willey Blade, [facility]  [address] [place], [phone] - [facility]- [address] [place] [address] [place]  [phone]       [facility]- [address]       -     [facility]- see information above (Speak to [employer] if you do not have insurance)       -  [facility]- [address] [place], [phone]       -  [facility]- [address],  [phone]       -  [facility]- [address] [phone]       -  Dr Julio Sicks-  [address], [address], [place], [phone]       -  [facility]- [address] [phone]       -  [facility]- [address] [phone], also [address]  [address], [phone]       -    [facility]- [address] [place], [phone], 1st & 3rd Saturday   every month, 10am-1pm  1) Find a Doctor and Pay Out of Pocket Although you won't have to find out who is covered by your insurance plan, it is a good idea to ask around and get recommendations. You will then need to call the office and see if the doctor you have chosen will accept you as a new patient and what types of options they offer for patients who are self-pay. Some doctors offer discounts or will set up payment plans for their patients who do not have insurance, but you will need to ask so you aren't surprised when you get to your appointment.  2) Contact Your Local Health Department Not all health departments have  doctors that can see patients for sick visits, but many do, so it is worth a call to see if yours does. If you don't know where your local health department is, you can check in your phone book. The CDC also has a tool to help you locate your state's health department, and many state websites also have listings of all of their local health departments.  3) Find a Walk-in Clinic If your illness is not likely to be very severe or complicated, you may want to try a walk in clinic. These are popping up all over the country in pharmacies, drugstores, and shopping centers. They're usually staffed by nurse practitioners or physician assistants that have been trained to treat common illnesses and complaints. They're usually fairly quick and inexpensive. However, if you have serious medical issues or chronic medical problems, these are probably not your best option  STD Testing - [facility] Department of [facility], STD Clinic, [address] [place], phone [phone] or [phone].  Monday - Friday, call for an appointment. [facility] Department of [employer], STD Clinic, [address], [place], phone [phone] or [phone].  Monday - Friday, call for an appointment.  Abuse/Neglect: [facility] Child Abuse Hotline 857-284-7726 [facility] Child Abuse Hotline 234-344-0059 (After Hours)  Emergency Shelter:  [facility] 757-399-8621  Maternity Homes: - [facility] ([phone] - [facility] [phone]  MRSA Hotline #:   289-339-8026  [facility] Resources  [facility]  United [employer] [facility] [place] Dept. [address]                 [address]         [address] [place] [address]  [place]                                               Cristobal Goldmann Phone:  [phone]                                  Phone:   [phone]  Phone:  [phone]  [facility], [phone] - [phone]       -     [facility] in [place], [address],                                  [phone], [facility] Child Abuse Hotline 475-518-4038 or 616-050-1684 (After Hours)   Behavioral Health Services  Substance Abuse Resources: - [facility] Drug [facility]  ([phone] - [facility] [phone] - [facility] 234-110-0274 Floydene Flock [phone] - Residential & Outpatient Substance Abuse Program  [phone]  Psychological Services: [facility]  [phone] [facility] Services  3522137124 - [facility], [phone] [place]. [address], [place], ACCESS LINE: 228-241-1200 or [phone], [employer]  Dental Assistance  If unable to pay or uninsured, contact:  [facility] or [facility]. to become qualified for the adult dental clinic.  Patients with Medicaid: [facility] [phone] [address], [phone] [address], [phone]  If unable to pay, or uninsured, contact [facility] [phone]) or [facility] Department [phone] in [place], [phone] in [facility]) to become qualified for the adult dental clinic  Other Low-Cost Community Dental Services: - [facility]- [address] [place], [place], [place], [address], [phone], [address]. [phone], 2nd and 4th Thursday of the month at 6:30am.  10 clients each day by appointment, can sometimes see walk-in patients if someone does not show for an appointment. [facility]- [address] [place], [place], [address], [phone] - [facility]- [address], [place], [place], [address], [phone] - [facility]- [phone] [facility]  Department- [phone] [facility] Department- [phone]

## 2011-07-29 NOTE — ED Notes (Signed)
Pt reports window closing on left hand and now having pain

## 2011-09-24 ENCOUNTER — Encounter (HOSPITAL_COMMUNITY): Payer: Self-pay

## 2011-09-24 ENCOUNTER — Emergency Department (HOSPITAL_COMMUNITY)
Admission: EM | Admit: 2011-09-24 | Discharge: 2011-09-24 | Disposition: A | Discharge status: Home Health | Payer: Medicaid Other | Attending: Emergency Medicine | Admitting: Emergency Medicine

## 2011-09-24 DIAGNOSIS — F17200 Nicotine dependence, unspecified, uncomplicated: Secondary | ICD-10-CM | POA: Insufficient documentation

## 2011-09-24 DIAGNOSIS — B86 Scabies: Secondary | ICD-10-CM | POA: Insufficient documentation

## 2011-09-24 MED ORDER — HYDROXYZINE HCL 25 MG PO TABS: 25.0000 mg | ORAL_TABLET | Freq: Four times a day (QID) | ORAL | Status: AC

## 2011-09-24 MED ORDER — PERMETHRIN 5 % EX CREA: TOPICAL_CREAM | CUTANEOUS | Status: AC

## 2011-09-24 NOTE — ED Notes (Signed)
Pt complains of itching all over and rash ntoed to back, sts lives with others and noone else has symptoms. Started 1 week ago.

## 2011-09-24 NOTE — ED Provider Notes (Signed)
History   This chart was scribed for Rolan Bucco, MD by Shari Heritage. The patient was seen in room TR11C/TR11C. Patient's care was started at 1120.     CSN: [id number]  Arrival date & time 09/24/11  1120   First MD Initiated Contact with Patient 09/24/11 1239      Chief Complaint  Patient presents with  . Rash  . Pruritis    (Consider location/radiation/quality/duration/timing/severity/associated sxs/prior treatment) The history is provided by the patient. No language interpreter was used.   Peter Waller is a 26 y.o. male who presents to the Emergency Department complaining of a diffuse, moderate, itchy rash to his legs, arms, hands and back onset 1 week ago. Patient states that other contacts do not also have a rash. Patient denies fever, nausea or vomiting. He has no oral lesions. Patient says that he has a history of scabies. Patient is a current everyday smoker.   No past medical history on file.  Past Surgical History  Procedure Date  . Ear tube removal     No family history on file.  History  Substance Use Topics  . Smoking status: Current Everyday Smoker  . Smokeless tobacco: Not on file  . Alcohol Use: Yes     REQUESTING DETOX FOR ALCOHOL ABUSE DRINKS DAILY      Review of Systems  Constitutional: Negative for fever.  HENT: Negative for neck pain.   Respiratory: Negative.   Cardiovascular: Negative.   Gastrointestinal: Negative for nausea and vomiting.  Musculoskeletal: Negative for back pain.  Skin: Positive for rash.  Neurological: Negative for headaches.    Allergies  Aloe  Home Medications   Current Outpatient Rx  Name Route Sig Dispense Refill  . HYDROXYZINE HCL 25 MG PO TABS Oral Take 1 tablet (25 mg total) by mouth every 6 (six) hours. 12 tablet 0  . PERMETHRIN 5 % EX CREA  Apply to affected area once 60 g 0    BP 128/81  Pulse 77  Temp 98.3 F (36.8 C) (Oral)  Resp 18  SpO2 96%  Physical Exam  Constitutional: He is oriented  to person, place, and time. He appears well-developed and well-nourished.  HENT:  Head: Normocephalic and atraumatic.  Eyes: Pupils are equal, round, and reactive to light.  Neck: Normal range of motion. Neck supple.  Cardiovascular: Normal rate, regular rhythm and normal heart sounds.   Pulmonary/Chest: Effort normal and breath sounds normal. No respiratory distress. He has no wheezes. He has no rales. He exhibits no tenderness.  Abdominal: Soft. Bowel sounds are normal. There is no tenderness. There is no rebound and no guarding.  Musculoskeletal: Normal range of motion. He exhibits no edema.  Lymphadenopathy:    He has no cervical adenopathy.  Neurological: He is alert and oriented to person, place, and time.  Skin: Skin is warm and dry. Lesion and rash noted. Rash is papular.       Diffuse papular rash with excoriated areas. Lesions in the web spaces of fingers. No oral lesions.  Psychiatric: He has a normal mood and affect.    ED Course  Procedures (including critical care time) DIAGNOSTIC STUDIES: Oxygen Saturation is 96% on room air, adequate by my interpretation.    COORDINATION OF CARE: 12:42pm- Patient informed of current plan for treatment and evaluation and agrees with plan at this time.     Labs Reviewed - No data to display No results found.   1. Scabies       MDM  Will start elimite cream, atarax.  Return as needed.      I personally performed the services described in this documentation, which was scribed in my presence.  The recorded information has been reviewed and considered.    Rolan Bucco, MD 09/24/11 1254

## 2011-10-03 ENCOUNTER — Emergency Department (HOSPITAL_COMMUNITY)
Admission: EM | Admit: 2011-10-03 | Discharge: 2011-10-04 | Disposition: A | Discharge status: Home / Self Care | Payer: Medicaid Other | Attending: Emergency Medicine | Admitting: Emergency Medicine

## 2011-10-03 ENCOUNTER — Encounter (HOSPITAL_COMMUNITY): Payer: Self-pay | Admitting: Emergency Medicine

## 2011-10-03 DIAGNOSIS — L0231 Cutaneous abscess of buttock: Secondary | ICD-10-CM | POA: Insufficient documentation

## 2011-10-03 DIAGNOSIS — L03317 Cellulitis of buttock: Secondary | ICD-10-CM | POA: Insufficient documentation

## 2011-10-03 NOTE — ED Notes (Signed)
PT. REPORTS ABSCESS / ? INSECT BITE AT LEFT BUTTOCK TODAY , DENIES DRAINAGE.

## 2011-10-04 MED ORDER — CEPHALEXIN 500 MG PO CAPS: 500.0000 mg | ORAL_CAPSULE | Freq: Four times a day (QID) | ORAL | Status: AC

## 2011-10-04 MED ORDER — HYDROCODONE-ACETAMINOPHEN 5-500 MG PO TABS: 1.0000 | ORAL_TABLET | Freq: Four times a day (QID) | ORAL | Status: AC | PRN

## 2011-10-04 MED ORDER — SULFAMETHOXAZOLE-TRIMETHOPRIM 800-160 MG PO TABS: 1.0000 | ORAL_TABLET | Freq: Two times a day (BID) | ORAL | Status: AC

## 2011-10-04 NOTE — ED Notes (Signed)
Pt denies any questions and verbalizes understanding for follow up instructions and prescriptions.

## 2011-10-04 NOTE — ED Provider Notes (Signed)
Medical screening examination/treatment/procedure(s) were performed by non-physician practitioner and as supervising physician I was immediately available for consultation/collaboration.  Glynn Octave, MD 10/04/11 [phone]

## 2011-10-04 NOTE — ED Provider Notes (Signed)
History     CSN: [id number]  Arrival date & time 10/03/11  2054   First MD Initiated Contact with Patient 10/03/11 2331      Chief Complaint  Patient presents with  . Abscess    (Consider location/radiation/quality/duration/timing/severity/associated sxs/prior treatment) Patient is a 26 y.o. male presenting with abscess. The history is provided by the patient.  Abscess  This is a new problem. The current episode started less than one week ago. The onset was sudden. The problem has been unchanged. The abscess is present on the left buttock. The problem is moderate. The abscess is characterized by redness, painfulness and swelling. Pertinent negatives include no anorexia, no fever and no vomiting.  Pt states he thinks he may have been bitten by a spider. Reports red swollen area to left buttocks.  No history of the same. No drainage. Unable to sit. No fever, chills, malaise.   History reviewed. No pertinent past medical history.  Past Surgical History  Procedure Date  . Ear tube removal   . Appendectomy     No family history on file.  History  Substance Use Topics  . Smoking status: Current Everyday Smoker  . Smokeless tobacco: Not on file  . Alcohol Use: Yes     REQUESTING DETOX FOR ALCOHOL ABUSE DRINKS DAILY      Review of Systems  Constitutional: Negative for fever and chills.  HENT: Negative for neck pain and neck stiffness.   Respiratory: Negative.   Cardiovascular: Negative.   Gastrointestinal: Negative for nausea, vomiting, abdominal pain and anorexia.  Skin: Positive for wound.  Neurological: Negative for weakness, numbness and headaches.    Allergies  Aloe  Home Medications   Current Outpatient Rx  Name Route Sig Dispense Refill  . HYDROXYZINE HCL 25 MG PO TABS Oral Take 1 tablet (25 mg total) by mouth every 6 (six) hours. 12 tablet 0    BP 120/72  Pulse 76  Temp 98.9 F (37.2 C) (Oral)  Resp 16  SpO2 94%  Physical Exam  Nursing note and  vitals reviewed. Constitutional: He appears well-developed and well-nourished. No distress.  Eyes: Conjunctivae are normal.  Cardiovascular: Normal rate, regular rhythm and normal heart sounds.   Pulmonary/Chest: Effort normal and breath sounds normal. No respiratory distress. He has no wheezes. He has no rales.  Neurological: He is alert.  Skin: Skin is warm and dry.       3cm area of induration, swelling, tenderness to the left buttocks. No drainage.   Psychiatric: He has a normal mood and affect.    ED Course  Procedures (including critical care time)  INCISION AND DRAINAGE Performed by: Jaynie Crumble A Consent: Verbal consent obtained. Risks and benefits: risks, benefits and alternatives were discussed Type: abscess  Body area: left buttocks  Anesthesia: local infiltration  Local anesthetic: lidocaine 2% w/ epinephrine  Anesthetic total: 3 ml  Complexity: complex Blunt dissection to break up loculations  Drainage: purulent  Drainage amount: moderate  Packing material: 1/4 in iodoform gauze  Patient tolerance: Patient tolerated the procedure well with no immediate complications.   Abscess drained. Pt tolerated procedure well. Antibiotics and pain medications. Follow up with pcp as needed, or return in two days for recheck.   1. Abscess of buttock, left       MDM          Lottie Mussel, PA 10/04/11 0011

## 2011-10-07 ENCOUNTER — Encounter (HOSPITAL_COMMUNITY): Payer: Self-pay | Admitting: *Deleted

## 2011-10-07 ENCOUNTER — Emergency Department (HOSPITAL_COMMUNITY)
Admission: EM | Admit: 2011-10-07 | Discharge: 2011-10-07 | Disposition: A | Discharge status: Home / Self Care | Payer: Medicaid Other | Attending: Emergency Medicine | Admitting: Emergency Medicine

## 2011-10-07 DIAGNOSIS — L0231 Cutaneous abscess of buttock: Secondary | ICD-10-CM | POA: Insufficient documentation

## 2011-10-07 DIAGNOSIS — F1020 Alcohol dependence, uncomplicated: Secondary | ICD-10-CM | POA: Insufficient documentation

## 2011-10-07 DIAGNOSIS — L03317 Cellulitis of buttock: Secondary | ICD-10-CM | POA: Insufficient documentation

## 2011-10-07 DIAGNOSIS — F17200 Nicotine dependence, unspecified, uncomplicated: Secondary | ICD-10-CM | POA: Insufficient documentation

## 2011-10-07 NOTE — ED Provider Notes (Signed)
History     CSN: [id number]  Arrival date & time 10/07/11  4098   First MD Initiated Contact with Patient 10/07/11 [phone]      Chief Complaint  Patient presents with  . Wound Check    L buttock    (Consider location/radiation/quality/duration/timing/severity/associated sxs/prior treatment) Patient is a 26 y.o. male presenting with wound check. The history is provided by the patient.  Wound Check  He was treated in the ED 2 to 3 days ago. Previous treatment in the ED includes I&D of abscess. Treatments since wound repair include oral antibiotics. His temperature was unmeasured prior to arrival. The redness has improved. The swelling has improved.  Pt was seen here 3 days ago, had abscess I&Ded to left buttocks. States taking antibiotics. Abscess is improving. No other complaints.  History reviewed. No pertinent past medical history.  Past Surgical History  Procedure Date  . Ear tube removal   . Appendectomy     History reviewed. No pertinent family history.  History  Substance Use Topics  . Smoking status: Current Everyday Smoker -- 0.5 packs/day  . Smokeless tobacco: Current User  . Alcohol Use: Yes     REQUESTING DETOX FOR ALCOHOL ABUSE DRINKS DAILY      Review of Systems  Constitutional: Negative for fever and chills.  HENT: Negative for neck pain and neck stiffness.   Respiratory: Negative.   Cardiovascular: Negative.   Musculoskeletal: Negative.   Skin: Positive for wound.  Neurological: Negative for weakness.    Allergies  Aloe  Home Medications   Current Outpatient Rx  Name Route Sig Dispense Refill  . CEPHALEXIN 500 MG PO CAPS Oral Take 1 capsule (500 mg total) by mouth 4 (four) times daily. 28 capsule 0  . HYDROCODONE-ACETAMINOPHEN 5-500 MG PO TABS Oral Take 1-2 tablets by mouth every 6 (six) hours as needed for pain. 15 tablet 0  . SULFAMETHOXAZOLE-TRIMETHOPRIM 800-160 MG PO TABS Oral Take 1 tablet by mouth 2 (two) times daily. 14 tablet 0    BP  104/67  Pulse 73  Temp 97.7 F (36.5 C) (Oral)  Resp 16  SpO2 98%  Physical Exam  Nursing note and vitals reviewed. Constitutional: He appears well-developed and well-nourished. No distress.  HENT:  Head: Normocephalic.  Eyes: Conjunctivae are normal.  Neck: Neck supple.  Cardiovascular: Normal rate, regular rhythm and normal heart sounds.   Pulmonary/Chest: Effort normal and breath sounds normal. No respiratory distress. He has no wheezes. He has no rales.  Neurological: He is alert.  Skin: Skin is warm and dry.       Left buttocks abscess, about 3cm in diameter, minimal draining purulent drainage, No surrounding cellulitis. Slight tenderness with palpation.   Psychiatric: He has a normal mood and affect.    ED Course  Procedures (including critical care time)  Abscess healing well, packing came out with dressing change. No signs of surrounding cellulitis. Pt afebrile. Non toxic. continue soaks, antibiotics. Follow up as needed.  1. Abscess of right buttock       MDM         Lottie Mussel, PA 10/07/11 [phone]

## 2011-10-07 NOTE — ED Notes (Signed)
To ED for eval of spider bite recheck on L buttock

## 2011-10-07 NOTE — ED Provider Notes (Signed)
Medical screening examination/treatment/procedure(s) were performed by non-physician practitioner and as supervising physician I was immediately available for consultation/collaboration.  Tomaz Janis K Linker, MD 10/07/11 0924 

## 2011-10-20 ENCOUNTER — Emergency Department (HOSPITAL_COMMUNITY)
Admission: EM | Admit: 2011-10-20 | Discharge: 2011-10-20 | Disposition: A | Discharge status: Home / Self Care | Payer: Medicaid Other | Source: Home / Self Care | Attending: Family Medicine | Admitting: Family Medicine

## 2011-10-20 ENCOUNTER — Emergency Department (INDEPENDENT_AMBULATORY_CARE_PROVIDER_SITE_OTHER): Payer: Medicaid Other

## 2011-10-20 ENCOUNTER — Encounter (HOSPITAL_COMMUNITY): Payer: Self-pay | Admitting: *Deleted

## 2011-10-20 DIAGNOSIS — S92401A Displaced unspecified fracture of right great toe, initial encounter for closed fracture: Secondary | ICD-10-CM

## 2011-10-20 DIAGNOSIS — S92919A Unspecified fracture of unspecified toe(s), initial encounter for closed fracture: Secondary | ICD-10-CM

## 2011-10-20 MED ORDER — HYDROCODONE-ACETAMINOPHEN 5-325 MG PO TABS: 1.0000 | ORAL_TABLET | Freq: Four times a day (QID) | ORAL | Status: AC | PRN
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
## 2012-03-10 ENCOUNTER — Emergency Department (HOSPITAL_COMMUNITY): Payer: Medicaid Other

## 2012-03-10 ENCOUNTER — Emergency Department (HOSPITAL_COMMUNITY)
Admission: EM | Admit: 2012-03-10 | Discharge: 2012-03-10 | Disposition: A | Discharge status: Home / Self Care | Payer: Medicaid Other | Attending: Emergency Medicine | Admitting: Emergency Medicine

## 2012-03-10 ENCOUNTER — Encounter (HOSPITAL_COMMUNITY): Payer: Self-pay | Admitting: Family Medicine

## 2012-03-10 DIAGNOSIS — S60229A Contusion of unspecified hand, initial encounter: Secondary | ICD-10-CM | POA: Insufficient documentation

## 2012-03-10 DIAGNOSIS — F17200 Nicotine dependence, unspecified, uncomplicated: Secondary | ICD-10-CM | POA: Insufficient documentation

## 2012-03-10 MED ORDER — PERCOCET 5-325 MG PO TABS: 1.0000 | ORAL_TABLET | Freq: Four times a day (QID) | ORAL | Status: DC | PRN

## 2012-03-10 MED ORDER — AMOXICILLIN-POT CLAVULANATE 875-125 MG PO TABS: 1.0000 | ORAL_TABLET | Freq: Two times a day (BID) | ORAL | Status: DC

## 2012-03-10 NOTE — ED Notes (Signed)
Pt's hand cleaned with soap and water. Pat dry. Tolerated well.

## 2012-03-10 NOTE — ED Provider Notes (Signed)
Medical screening examination/treatment/procedure(s) were conducted as a shared visit with non-physician practitioner(s) and myself.  I personally evaluated the patient during the encounter   Loren Racer, MD 03/10/12 1505
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
## 2012-03-10 NOTE — ED Notes (Signed)
Transported to radiology in wheelchair with tech.

## 2012-03-10 NOTE — ED Notes (Signed)
PA at bedside.
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
## 2012-11-03 ENCOUNTER — Emergency Department (HOSPITAL_COMMUNITY)
Admission: EM | Admit: 2012-11-03 | Discharge: 2012-11-04 | Disposition: A | Discharge status: Home / Self Care | Payer: Medicaid Other | Attending: Emergency Medicine | Admitting: Emergency Medicine

## 2012-11-03 ENCOUNTER — Encounter (HOSPITAL_COMMUNITY): Payer: Self-pay | Admitting: Emergency Medicine

## 2012-11-03 DIAGNOSIS — Z7189 Other specified counseling: Secondary | ICD-10-CM

## 2012-11-03 DIAGNOSIS — F17200 Nicotine dependence, unspecified, uncomplicated: Secondary | ICD-10-CM | POA: Insufficient documentation

## 2012-11-03 DIAGNOSIS — F1010 Alcohol abuse, uncomplicated: Secondary | ICD-10-CM | POA: Insufficient documentation

## 2012-11-03 HISTORY — DX: Alcohol abuse, uncomplicated: F10.10

## 2012-11-03 LAB — CBC WITH DIFFERENTIAL/PLATELET
Basophils Relative: 1 % (ref 0–1)
Hemoglobin: 14.2 g/dL (ref 13.0–17.0)
Lymphs Abs: 3.5 10*3/uL (ref 0.7–4.0)
MCHC: 34.5 g/dL (ref 30.0–36.0)
Monocytes Relative: 7 % (ref 3–12)
Neutro Abs: 3.9 10*3/uL (ref 1.7–7.7)
Neutrophils Relative %: 46 % (ref 43–77)
Platelets: 247 10*3/uL (ref 150–400)
RBC: 4.18 MIL/uL — ABNORMAL LOW (ref 4.22–5.81)

## 2012-11-03 LAB — COMPREHENSIVE METABOLIC PANEL
ALT: 10 U/L (ref 0–53)
AST: 17 U/L (ref 0–37)
Albumin: 4.4 g/dL (ref 3.5–5.2)
Alkaline Phosphatase: 69 U/L (ref 39–117)
BUN: 14 mg/dL (ref 6–23)
Chloride: 102 mEq/L (ref 96–112)
Potassium: 4.2 mEq/L (ref 3.5–5.1)
Sodium: 140 mEq/L (ref 135–145)
Total Bilirubin: 0.8 mg/dL (ref 0.3–1.2)

## 2012-11-03 LAB — RAPID URINE DRUG SCREEN, HOSP PERFORMED
Barbiturates: NOT DETECTED
Tetrahydrocannabinol: NOT DETECTED

## 2012-11-03 NOTE — ED Notes (Signed)
Pt. requesting detox for alcohol abuse as required by his probation officer, last drink 3 months ago , pt. denies suicidal / homicidal ideation .

## 2012-11-04 NOTE — ED Provider Notes (Signed)
CSN: [id number]     Arrival date & time 11/03/12  1932 History   First MD Initiated Contact with Patient 11/03/12 2255     Chief Complaint  Patient presents with  . Alcohol Problem   (Consider location/radiation/quality/duration/timing/severity/associated sxs/prior Treatment) HPI Comments: 27 year old male here for alcohol assessment. His parole officer started and noted a, which she has an appointment in 2 days. Patient's last drink was over 3 months ago. He is really requesting medical clearance to go today marked at that time. He denies any suicidal or homicidal ideation. He is here supervised wife and his father who are in agreement that he needs this treatment. Patient is willing to go wants to go to the alcohol rehabilitation 28 day program.   Patient is a 27 y.o. male presenting with alcohol problem. The history is provided by the patient.  Alcohol Problem This is a new problem. The current episode started more than 1 week ago. The problem occurs constantly. The problem has been rapidly improving. Pertinent negatives include no chest pain, no abdominal pain, no headaches and no shortness of breath. Nothing aggravates the symptoms. Nothing relieves the symptoms.    Past Medical History  Diagnosis Date  . Alcohol abuse    Past Surgical History  Procedure Laterality Date  . Ear tube removal    . Appendectomy     No family history on file. History  Substance Use Topics  . Smoking status: Current Every Day Smoker -- 0.50 packs/day  . Smokeless tobacco: Current User  . Alcohol Use: Yes    Review of Systems  Constitutional: Negative for fever and chills.  Respiratory: Negative for shortness of breath.   Cardiovascular: Negative for chest pain.  Gastrointestinal: Negative for abdominal pain.  Neurological: Negative for headaches.  All other systems reviewed and are negative.    Allergies  Aloe  Home Medications  No current outpatient prescriptions on file. BP 132/80   Pulse 61  Temp(Src) 97.8 F (36.6 C) (Oral)  Resp 18  Wt 177 lb 6 oz (80.457 kg)  SpO2 100% Physical Exam  Nursing note and vitals reviewed. Constitutional: He is oriented to person, place, and time. He appears well-developed and well-nourished. No distress.  HENT:  Head: Normocephalic and atraumatic.  Mouth/Throat: No oropharyngeal exudate.  Eyes: EOM are normal. Pupils are equal, round, and reactive to light.  Neck: Normal range of motion. Neck supple.  Cardiovascular: Normal rate and regular rhythm.  Exam reveals no friction rub.   No murmur heard. Pulmonary/Chest: Effort normal and breath sounds normal. No respiratory distress. He has no wheezes. He has no rales.  Abdominal: He exhibits no distension. There is no tenderness. There is no rebound.  Musculoskeletal: Normal range of motion. He exhibits no edema.  Neurological: He is alert and oriented to person, place, and time.  Skin: He is not diaphoretic.    ED Course  Procedures (including critical care time) Labs Review Labs Reviewed  CBC WITH DIFFERENTIAL - Abnormal; Notable for the following:    RBC 4.18 (*)    All other components within normal limits  COMPREHENSIVE METABOLIC PANEL - Abnormal; Notable for the following:    Glucose, Bld 100 (*)    All other components within normal limits  ETHANOL  URINE RAPID DRUG SCREEN (HOSP PERFORMED)   Imaging Review No results found.  MDM   1. Drug or alcohol risk assessment or counseling     And 27 year old male here for medical clearance to go today marked for  assessment for 28 alcohol rehabilitation program. Labs checked with normal white count. His LFTs are normal. Patient denies any illness recently and denies symptoms like fever chest pain , shortness breath, nausea, vomiting, diarrhea. Patient well-appearing and in sound state  of mine. He denies any suicidal/homicidal ideations.  I believe he is medically clear to go to detox. Stable for discharge.  I have reviewed  all labs and imaging and considered them in my medical decision making.    Dagmar Hait, MD 11/04/12 0010

## 2012-11-04 NOTE — ED Notes (Signed)
Pt denies any pain or questions upon discharge. 

## 2013-01-10 ENCOUNTER — Encounter (HOSPITAL_COMMUNITY): Payer: Self-pay | Admitting: Emergency Medicine

## 2013-01-10 DIAGNOSIS — F1010 Alcohol abuse, uncomplicated: Secondary | ICD-10-CM | POA: Insufficient documentation

## 2013-01-10 DIAGNOSIS — F17200 Nicotine dependence, unspecified, uncomplicated: Secondary | ICD-10-CM | POA: Insufficient documentation

## 2013-01-10 NOTE — ED Notes (Addendum)
Patient presents stating he is wanting to go to [facility] for detox.  States he called them and they said they had beds.  Wife spoke with Misty Stanley at [facility]

## 2013-01-11 ENCOUNTER — Encounter (HOSPITAL_COMMUNITY): Payer: Self-pay | Admitting: *Deleted

## 2013-01-11 ENCOUNTER — Emergency Department (HOSPITAL_COMMUNITY)
Admission: EM | Admit: 2013-01-11 | Discharge: 2013-01-11 | Disposition: A | Discharge status: Home / Self Care | Payer: Medicaid Other | Attending: Emergency Medicine | Admitting: Emergency Medicine

## 2013-01-11 DIAGNOSIS — F1010 Alcohol abuse, uncomplicated: Secondary | ICD-10-CM

## 2013-01-11 LAB — CBC
HCT: 45.6 % (ref 39.0–52.0)
Hemoglobin: 15.9 g/dL (ref 13.0–17.0)
MCH: 34.8 pg — ABNORMAL HIGH (ref 26.0–34.0)
MCHC: 34.9 g/dL (ref 30.0–36.0)
MCV: 99.8 fL (ref 78.0–100.0)
RDW: 13.6 % (ref 11.5–15.5)

## 2013-01-11 LAB — ETHANOL: Alcohol, Ethyl (B): 69 mg/dL — ABNORMAL HIGH (ref 0–11)

## 2013-01-11 LAB — COMPREHENSIVE METABOLIC PANEL
Albumin: 4.1 g/dL (ref 3.5–5.2)
BUN: 9 mg/dL (ref 6–23)
Calcium: 9.4 mg/dL (ref 8.4–10.5)
Creatinine, Ser: 0.86 mg/dL (ref 0.50–1.35)
GFR calc Af Amer: >90 mL/min (ref >90)
Glucose, Bld: 81 mg/dL (ref 70–99)
Total Protein: 7.4 g/dL (ref 6.0–8.3)

## 2013-01-11 LAB — RAPID URINE DRUG SCREEN, HOSP PERFORMED
Barbiturates: NOT DETECTED
Benzodiazepines: NOT DETECTED
Cocaine: POSITIVE — AB

## 2013-01-11 LAB — ACETAMINOPHEN LEVEL: Acetaminophen (Tylenol), Serum: <15 ug/mL (ref 10–30)

## 2013-01-11 LAB — SALICYLATE LEVEL: Salicylate Lvl: <2 mg/dL — ABNORMAL LOW (ref 2.8–20.0)

## 2013-01-11 MED ORDER — ONDANSETRON HCL 4 MG PO TABS: 4.0000 mg | ORAL_TABLET | Freq: Three times a day (TID) | ORAL | Status: DC | PRN

## 2013-01-11 MED ORDER — LORAZEPAM 1 MG PO TABS: 0.0000 mg | ORAL_TABLET | Freq: Two times a day (BID) | ORAL | Status: DC

## 2013-01-11 MED ORDER — NICOTINE 21 MG/24HR TD PT24: 21.0000 mg | MEDICATED_PATCH | Freq: Every day | TRANSDERMAL | Status: DC

## 2013-01-11 MED ORDER — ALUM & MAG HYDROXIDE-SIMETH 200-200-20 MG/5ML PO SUSP: 30.0000 mL | ORAL | Status: DC | PRN

## 2013-01-11 MED ORDER — ZOLPIDEM TARTRATE 5 MG PO TABS: 5.0000 mg | ORAL_TABLET | Freq: Every evening | ORAL | Status: DC | PRN

## 2013-01-11 MED ORDER — ACETAMINOPHEN 325 MG PO TABS: 650.0000 mg | ORAL_TABLET | ORAL | Status: DC | PRN

## 2013-01-11 MED ORDER — LORAZEPAM 1 MG PO TABS: 0.0000 mg | ORAL_TABLET | Freq: Four times a day (QID) | ORAL | Status: DC

## 2013-01-11 MED ORDER — IBUPROFEN 400 MG PO TABS: 600.0000 mg | ORAL_TABLET | Freq: Three times a day (TID) | ORAL | Status: DC | PRN

## 2013-01-11 MED ORDER — LORAZEPAM 1 MG PO TABS: 1.0000 mg | ORAL_TABLET | Freq: Three times a day (TID) | ORAL | Status: DC | PRN

## 2013-01-11 NOTE — ED Notes (Signed)
FAXED PT INFO TO [facility] AND [facility]

## 2013-01-11 NOTE — Progress Notes (Signed)
Writer informed the nurse working with the patient that the Tele Assessment machine is in use at [facility] with another patient.  Writer reported that someone from [facility] will call them back after the 7am shift report in order to set up a time to assess the patient.

## 2013-01-11 NOTE — ED Provider Notes (Signed)
CSN: [id number]     Arrival date & time 01/10/13  2145 History   First MD Initiated Contact with Patient 01/11/13 [phone]     Chief Complaint  Patient presents with  . Alcohol Problem   (Consider location/radiation/quality/duration/timing/severity/associated sxs/prior Treatment) HPI Comments: Patient is a 27 year old male with a history of alcohol abuse who presents seeking placement at [facility] for alcohol rehabilitation. Patient states he has been drinking for approximately 14 years and drinks about a 12 pack of beer day. Patient's last drink was yesterday. He denies history of seizure activity from alcohol withdrawal. Patient denies suicidal and homicidal ideations as well as illicit drug use. He states he was told by [facility] to come to the emergency department for medical clearance and placement. Patient denies pain any associated pain complaints.  The history is provided by the patient. No language interpreter was used.    Past Medical History  Diagnosis Date  . Alcohol abuse   . Alcohol abuse    Past Surgical History  Procedure Laterality Date  . Ear tube removal    . Appendectomy     History reviewed. No pertinent family history. History  Substance Use Topics  . Smoking status: Current Every Day Smoker -- 0.50 packs/day  . Smokeless tobacco: Current User  . Alcohol Use: Yes    Review of Systems  Psychiatric/Behavioral:       +ETOH abuse  All other systems reviewed and are negative.    Allergies  Aloe  Home Medications   Current Outpatient Rx  Name  Route  Sig  Dispense  Refill  . OVER THE COUNTER MEDICATION   Oral   Take 1 tablet by mouth daily as needed (allergy medication).          BP 121/73  Pulse 67  Temp(Src) 98.7 F (37.1 C) (Oral)  Resp 18  Wt 186 lb 3.2 oz (84.46 kg)  SpO2 98%  Physical Exam  Nursing note and vitals reviewed. Constitutional: He is oriented to person, place, and time. He appears well-developed and well-nourished. No distress.   HENT:  Head: Normocephalic and atraumatic.  Mouth/Throat: Oropharynx is clear and moist. No oropharyngeal exudate.  Eyes: Conjunctivae and EOM are normal. Pupils are equal, round, and reactive to light. No scleral icterus.  Neck: Normal range of motion.  Cardiovascular: Normal rate, regular rhythm and normal heart sounds.   Pulmonary/Chest: Effort normal and breath sounds normal. No respiratory distress. He has no wheezes. He has no rales.  Abdominal: Soft. He exhibits no distension. There is no tenderness. There is no rebound and no guarding.  Musculoskeletal: Normal range of motion.  Neurological: He is alert and oriented to person, place, and time.  Skin: Skin is warm and dry. No rash noted. He is not diaphoretic. No erythema. No pallor.  Psychiatric: He has a normal mood and affect. His behavior is normal.    ED Course  Procedures (including critical care time) Labs Review Labs Reviewed  CBC - Abnormal; Notable for the following:    MCH 34.8 (*)    All other components within normal limits  ETHANOL - Abnormal; Notable for the following:    Alcohol, Ethyl (B) 69 (*)    All other components within normal limits  SALICYLATE LEVEL - Abnormal; Notable for the following:    Salicylate Lvl <2.0 (*)    All other components within normal limits  URINE RAPID DRUG SCREEN (HOSP PERFORMED) - Abnormal; Notable for the following:    Cocaine POSITIVE (*)  All other components within normal limits  ACETAMINOPHEN LEVEL  COMPREHENSIVE METABOLIC PANEL   Imaging Review No results found.  EKG Interpretation   None       MDM   1. Alcohol abuse     4:00 - Patient present to the emergency department for medical clearance as he wishes to go to [facility] for rehab for ETOH abuse. Patient denies SI/HI or illicit drug use as well as a history of seizure activity from alcohol withdrawal. Patient states that he has never been sober or sought treatment before. Patient without any pain  complaints.  As today significant for an ethanol level of 69 and UDS positive for cocaine. Patient otherwise medically cleared. [facility]TS consulted placement. Psych hold orders placed.  6:30 - TTS evaluation pending at this time. Patient has remained hemodynamically stable.  Antony Madura, [place] 01/11/13 [phone]

## 2013-01-11 NOTE — ED Notes (Signed)
CALLED [facility]. PT IS UNDER REVIEW.

## 2013-01-11 NOTE — ED Notes (Signed)
PER JANICE AT [facility] THEY DO HAVE MALE DETOX BEDS. [facility] STATES THEY "MAY " HAVE A MALE DETOX BED.

## 2013-01-11 NOTE — ED Notes (Signed)
[facility] HAS ACCEPTED PT. [facility] HAS BEEN CALLED TO TRANSPORT

## 2013-01-11 NOTE — ED Notes (Signed)
Breakfast at bedside.

## 2013-01-11 NOTE — BH Assessment (Addendum)
Assessment Note  Peter Waller is a 27 y.o. male presenting to [facility] for alcohol detox.  Pt admits he uses cocaine occasionally, last use was 2 days ago--"I am not addicted".  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  Pt.'s last drink was 01/11/13 and he consumed 1-12pk of beer.  Pt denies any current w/d sxs, however says when he experiencing w/d's, he has nausea and tremors.    When asked why he wants to stop drinking--"I keep getting into trouble".  Pt has up coming court dates: 02/03/13--probation violation for felony larceny and 03/12/13--driving w/o proper tags.  Pt denies SI/HI/AVH, no seizures/blackouts.  Pt.'s stressors contributing to alcohol addiction: father's health, financial, unemployed, legal issues.  Pt has previous detox treatment with behavioral health in 2009 and 2012.    Axis I: Alcohol Dependence  Axis II: Deferred Axis III:  Past Medical History  Diagnosis Date  . Alcohol abuse   . Alcohol abuse    Axis IV: economic problems, occupational problems, other psychosocial or environmental problems, problems related to legal system/crime, problems related to social environment and problems with primary support group Axis V: 51-60 moderate symptoms  Past Medical History:  Past Medical History  Diagnosis Date  . Alcohol abuse   . Alcohol abuse     Past Surgical History  Procedure Laterality Date  . Ear tube removal    . Appendectomy      Family History: History reviewed. No pertinent family history.  Social History:  reports that he has been smoking.  He uses smokeless tobacco. He reports that he drinks alcohol. He reports that he uses illicit drugs (Cocaine).  Additional Social History:  Alcohol / Drug Use Pain Medications: None  Prescriptions: None  Over the Counter: None  History of alcohol / drug use?: Yes Longest period of sobriety (when/how long): 2003--pt in  detox treatment  Negative Consequences of Use: Work / Web designer relationships;Legal;Financial Withdrawal Symptoms: Other (Comment) (no current w/d sxs ) Substance #1 Name of Substance 1: Alcohol  1 - Age of First Use: 12 YOM  1 - Amount (size/oz): 12 pk and 1 pint(pt reports occassional use) 1 - Frequency: Daily  1 - Duration: On-going  1 - Last Use / Amount: 01/11/13  CIWA: CIWA-Ar BP: 121/73 mmHg Pulse Rate: 67 Nausea and Vomiting: no nausea and no vomiting Tactile Disturbances: none Tremor: no tremor Auditory Disturbances: not present Paroxysmal Sweats: no sweat visible Visual Disturbances: not present Anxiety: no anxiety, at ease Agitation: normal activity Orientation and Clouding of Sensorium: oriented and can do serial additions COWS:    Allergies:  Allergies  Allergen Reactions  . Aloe Rash    Home Medications:  (Not in a hospital admission)  OB/GYN Status:  No LMP for male patient.  General Assessment Data Location of Assessment: [facility] ED Is this a Tele or Face-to-Face Assessment?: Tele Assessment Is this an Initial Assessment or a Re-assessment for this encounter?: Initial Assessment Living Arrangements: Parent (Lives with father ) Can pt return to current living arrangement?: Yes Admission Status: Voluntary Is patient capable of signing voluntary admission?: Yes Transfer from: Acute Hospital Referral Source: MD  Medical Screening Exam [facility] Walk-in ONLY) Medical Exam completed: No Reason for MSE not completed: Other: (None )  [facility] Crisis Care Plan Living Arrangements: Parent (Lives with father ) Name of Psychiatrist: None  Name of Therapist: None   Education  Status Is patient currently in school?: No Current Grade: None  Highest grade of school patient has completed: None  Name of school: None Contact person: None   Risk to self Suicidal Ideation: No Suicidal Intent: No Is patient at risk for suicide?: No Suicidal Plan?: No Access to  Means: No What has been your use of drugs/alcohol within the last 12 months?: Abusing: alcohol  Previous Attempts/Gestures: No How many times?: 0 Other Self Harm Risks: None  Triggers for Past Attempts: None known Intentional Self Injurious Behavior: None Family Suicide History: No Recent stressful life event(s): Financial Problems;Legal Issues;Other (Comment) (Father's health; up coming court dates ) Persecutory voices/beliefs?: No Depression: Yes Depression Symptoms: Loss of interest in usual pleasures Substance abuse history and/or treatment for substance abuse?: Yes Suicide prevention information given to non-admitted patients: Not applicable  Risk to Others Homicidal Ideation: No Thoughts of Harm to Others: No Current Homicidal Intent: No Current Homicidal Plan: No Access to Homicidal Means: No Identified Victim: None  History of harm to others?: No Assessment of Violence: None Noted Violent Behavior Description: None  Does patient have access to weapons?: No Criminal Charges Pending?: Yes Describe Pending Criminal Charges: Probation violation(felony larceny); Driving w/o proper tags Does patient have a court date: Yes Court Date: 02/03/13 (03/12/13)  Psychosis Hallucinations: None noted Delusions: None noted  Mental Status Report Appear/Hygiene: Disheveled Eye Contact: Good Motor Activity: Unremarkable Speech: Logical/coherent Level of Consciousness: Alert Mood: Sad Affect: Appropriate to circumstance;Sad Anxiety Level: None Thought Processes: Coherent;Relevant Judgement: Unimpaired Orientation: Person;Place;Time;Situation Obsessive Compulsive Thoughts/Behaviors: None  Cognitive Functioning Concentration: Normal Memory: Recent Intact;Remote Intact IQ: Average Insight: Fair Impulse Control: Fair Appetite: Good Weight Loss: 0 Weight Gain: 0 Sleep: No Change Total Hours of Sleep: 6 Vegetative Symptoms: None  ADLScreening [facility] Assessment  Services) Patient's cognitive ability adequate to safely complete daily activities?: Yes Patient able to express need for assistance with ADLs?: Yes Independently performs ADLs?: Yes (appropriate for developmental age)  Prior Inpatient Therapy Prior Inpatient Therapy: Yes Prior Therapy Dates: 2009,2012 Prior Therapy Facilty/Provider(s): [facility]  Reason for Treatment: Detox   Prior Outpatient Therapy Prior Outpatient Therapy: No Prior Therapy Dates: None  Prior Therapy Facilty/Provider(s): None  Reason for Treatment: None   ADL Screening (condition at time of admission) Patient's cognitive ability adequate to safely complete daily activities?: Yes Is the patient deaf or have difficulty hearing?: No Does the patient have difficulty seeing, even when wearing glasses/contacts?: No Does the patient have difficulty concentrating, remembering, or making decisions?: No Patient able to express need for assistance with ADLs?: Yes Does the patient have difficulty dressing or bathing?: No Independently performs ADLs?: Yes (appropriate for developmental age) Does the patient have difficulty walking or climbing stairs?: No Weakness of Legs: None Weakness of Arms/Hands: None  Home Assistive Devices/Equipment Home Assistive Devices/Equipment: None  Therapy Consults (therapy consults require a physician order) PT Evaluation Needed: No OT Evalulation Needed: No SLP Evaluation Needed: No Abuse/Neglect Assessment (Assessment to be complete while patient is alone) Physical Abuse: Denies Verbal Abuse: Denies Sexual Abuse: Denies Exploitation of patient/patient's resources: Denies Self-Neglect: Denies Values / Beliefs Cultural Requests During Hospitalization: None Spiritual Requests During Hospitalization: None Consults Spiritual Care Consult Needed: No Social Work Consult Needed: No Merchant navy officer (For Healthcare) Advance Directive: Patient does not have advance directive;Patient would not  like information Pre-existing out of facility DNR order (yellow form or pink MOST form): No Nutrition Screen- [facility] Adult/[facility]/AP Patient's home diet: Regular  Additional Information 1:1 In Past 12 Months?:  No CIRT Risk: No Elopement Risk: No Does patient have medical clearance?: Yes     Disposition:  Disposition Initial Assessment Completed for this Encounter: Yes Disposition of Patient: Inpatient treatment program;Referred to ([facility] ) Type of inpatient treatment program: Adult Patient referred to: [facility]  On Site Evaluation by:   Reviewed with Physician:    Murrell Redden 01/11/2013 8:50 AM

## 2013-01-11 NOTE — Discharge Instructions (Signed)
Alcohol Problems °Most adults who drink alcohol drink in moderation (not a lot) are at low risk for developing problems related to their drinking. However, all drinkers, including low-risk drinkers, should know about the health risks connected with drinking alcohol. °RECOMMENDATIONS FOR LOW-RISK DRINKING  °Drink in moderation. Moderate drinking is defined as follows:  °· Men - no more than 2 drinks per day. °· Nonpregnant women - no more than 1 drink per day. °· Over age 65 - no more than 1 drink per day. °A standard drink is 12 grams of pure alcohol, which is equal to a 12 ounce bottle of beer or wine cooler, a 5 ounce glass of wine, or 1.5 ounces of distilled spirits (such as whiskey, brandy, vodka, or rum).  °ABSTAIN FROM (DO NOT DRINK) ALCOHOL: °· When pregnant or considering pregnancy. °· When taking a medication that interacts with alcohol. °· If you are alcohol dependent. °· A medical condition that prohibits drinking alcohol (such as ulcer, liver disease, or heart disease). °DISCUSS WITH YOUR CAREGIVER: °· If you are at risk for coronary heart disease, discuss the potential benefits and risks of alcohol use: Light to moderate drinking is associated with lower rates of coronary heart disease in certain populations (for example, men over age 45 and postmenopausal women). Infrequent or nondrinkers are advised not to begin light to moderate drinking to reduce the risk of coronary heart disease so as to avoid creating an alcohol-related problem. Similar protective effects can likely be gained through proper diet and exercise. °· Women and the elderly have smaller amounts of body water than men. As a result women and the elderly achieve a higher blood alcohol concentration after drinking the same amount of alcohol. °· Exposing a fetus to alcohol can cause a broad range of birth defects referred to as Fetal Alcohol Syndrome (FAS) or Alcohol-Related Birth Defects (ARBD). Although FAS/ARBD is connected with excessive  alcohol consumption during pregnancy, studies also have reported neurobehavioral problems in infants born to mothers reporting drinking an average of 1 drink per day during pregnancy. °· Heavier drinking (the consumption of more than 4 drinks per occasion by men and more than 3 drinks per occasion by women) impairs learning (cognitive) and psychomotor functions and increases the risk of alcohol-related problems, including accidents and injuries. °CAGE QUESTIONS:  °· Have you ever felt that you should Cut down on your drinking? °· Have people Annoyed you by criticizing your drinking? °· Have you ever felt bad or Guilty about your drinking? °· Have you ever had a drink first thing in the morning to steady your nerves or get rid of a hangover (Eye opener)? °If you answered positively to any of these questions: You may be at risk for alcohol-related problems if alcohol consumption is:  °· Men: Greater than 14 drinks per week or more than 4 drinks per occasion. °· Women: Greater than 7 drinks per week or more than 3 drinks per occasion. °Do you or your family have a medical history of alcohol-related problems, such as: °· Blackouts. °· Sexual dysfunction. °· Depression. °· Trauma. °· Liver dysfunction. °· Sleep disorders. °· Hypertension. °· Chronic abdominal pain. °· Has your drinking ever caused you problems, such as problems with your family, problems with your work (or school) performance, or accidents/injuries? °· Do you have a compulsion to drink or a preoccupation with drinking? °· Do you have poor control or are you unable to stop drinking once you have started? °· Do you have to drink to   avoid withdrawal symptoms?  Do you have problems with withdrawal such as tremors, nausea, sweats, or mood disturbances?  Does it take more alcohol than in the past to get you high?  Do you feel a strong urge to drink?  Do you change your plans so that you can have a drink?  Do you ever drink in the morning to relieve  the shakes or a hangover? If you have answered a number of the previous questions positively, it may be time for you to talk to your caregivers, family, and friends and see if they think you have a problem. Alcoholism is a chemical dependency that keeps getting worse and will eventually destroy your health and relationships. Many alcoholics end up dead, impoverished, or in prison. This is often the end result of all chemical dependency.  Do not be discouraged if you are not ready to take action immediately.  Decisions to change behavior often involve up and down desires to change and feeling like you cannot decide.  Try to think more seriously about your drinking behavior.  Think of the reasons to quit. WHERE TO GO FOR ADDITIONAL INFORMATION   The National Institute on Alcohol Abuse and Alcoholism (NIAAA) BasicStudents.dkwww.niaaa.nih.gov  [employer] Council on Alcoholism and Drug Dependence (NCADD) www.ncadd.org  American Society of Addiction Medicine (ASAM) RoyalDiary.glwww.asam.org  Document Released: 02/05/2005 Document Revised: 04/30/2011 Document Reviewed: 09/24/2007 [facility] Patient Information 2014 [place], [place].  Alcohol Withdrawal Anytime drug use is interfering with normal living activities it has become abuse. This includes problems with family and friends. Psychological dependence has developed when your mind tells you that the drug is needed. This is usually followed by physical dependence when a continuing increase of drugs are required to get the same feeling or "high." This is known as addiction or chemical dependency. A person's risk is much higher if there is a history of chemical dependency in the family. Mild Withdrawal Following Stopping Alcohol, When Addiction or Chemical Dependency Has Developed When a person has developed tolerance to alcohol, any sudden stopping of alcohol can cause uncomfortable physical symptoms. Most of the time these are mild and consist of tremors in the hands and  increases in heart rate, breathing, and temperature. Sometimes these symptoms are associated with anxiety, panic attacks, and bad dreams. There may also be stomach upset. Normal sleep patterns are often interrupted with periods of inability to sleep (insomnia). This may last for 6 months. Because of this discomfort, many people choose to continue drinking to get rid of this discomfort and to try to feel normal. Severe Withdrawal with Decreased or No Alcohol Intake, When Addiction or Chemical Dependency Has Developed About five percent of alcoholics will develop signs of severe withdrawal when they stop using alcohol. One sign of this is development of generalized seizures (convulsions). Other signs of this are severe agitation and confusion. This may be associated with believing in things which are not real or seeing things which are not really there (delusions and hallucinations). Vitamin deficiencies are usually present if alcohol intake has been long-term. Treatment for this most often requires hospitalization and close observation. Addiction can only be helped by stopping use of all chemicals. This is hard but may save your life. With continual alcohol use, possible outcomes are usually loss of self respect and esteem, violence, and death. Addiction cannot be cured but it can be stopped. This often requires outside help and the care of professionals. Treatment centers are listed in the yellow pages under Cocaine, Narcotics, and Alcoholics  Anonymous. Most hospitals and clinics can refer you to a specialized care center. It is not necessary for you to go through the uncomfortable symptoms of withdrawal. Your caregiver can provide you with medicines that will help you through this difficult period. Try to avoid situations, friends, or drugs that made it possible for you to keep using alcohol in the past. Learn how to say no. It takes a long period of time to overcome addictions to all drugs, including  alcohol. There may be many times when you feel as though you want a drink. After getting rid of the physical addiction and withdrawal, you will have a lessening of the craving which tells you that you need alcohol to feel normal. Call your caregiver if more support is needed. Learn who to talk to in your family and among your friends so that during these periods you can receive outside help. Alcoholics Anonymous (AA) has helped many people over the years. To get further help, contact AA or call your caregiver, counselor, or clergyperson. Al-Anon and Alateen are support groups for friends and family members of an alcoholic. The people who love and care for an alcoholic often need help, too. For information about these organizations, check your phone directory or call a local alcoholism treatment center.  SEEK IMMEDIATE MEDICAL CARE IF:   You have a seizure.  You have a fever.  You experience uncontrolled vomiting or you vomit up blood. This may be bright red or look like black coffee grounds.  You have blood in the stool. This may be bright red or appear as a black, tarry, bad-smelling stool.  You become lightheaded or faint. Do not drive if you feel this way. Have someone else drive you or call 161 for help.  You become more agitated or confused.  You develop uncontrolled anxiety.  You begin to see things that are not really there (hallucinate). Your caregiver has determined that you completely understand your medical condition, and that your mental state is back to normal. You understand that you have been treated for alcohol withdrawal, have agreed not to drink any alcohol for a minimum of 1 day, will not operate a car or other machinery for 24 hours, and have had an opportunity to ask any questions about your condition. Document Released: 11/15/2004 Document Revised: 04/30/2011 Document Reviewed: 09/24/2007 [facility] Patient Information 2014 [place], [place].

## 2013-01-11 NOTE — ED Notes (Signed)
telepsych in progress 

## 2013-01-11 NOTE — ED Notes (Signed)
CALLED [facility]. THEY ARE REVIEWING PATIENT NOW

## 2013-01-11 NOTE — ED Notes (Signed)
2 belonging bags placed in bin in soiled utility labeled c24

## 2013-01-11 NOTE — ED Notes (Signed)
Faxed updated alcohol result to [facility]

## 2013-01-11 NOTE — ED Notes (Signed)
Pt placed in paper scrubs.

## 2013-01-11 NOTE — ED Provider Notes (Signed)
Medical screening examination/treatment/procedure(s) were performed by non-physician practitioner and as supervising physician I was immediately available for consultation/collaboration.    Gregory Barrick M Anthonella Klausner, MD 01/11/13 0703 

## 2013-01-11 NOTE — ED Notes (Signed)
Pt wanded by security. 

## 2013-03-24 ENCOUNTER — Emergency Department (HOSPITAL_COMMUNITY)
Admission: EM | Admit: 2013-03-24 | Discharge: 2013-03-24 | Disposition: A | Discharge status: Home / Self Care | Payer: Medicaid Other | Attending: Emergency Medicine | Admitting: Emergency Medicine

## 2013-03-24 ENCOUNTER — Encounter (HOSPITAL_COMMUNITY): Payer: Self-pay | Admitting: Emergency Medicine

## 2013-03-24 DIAGNOSIS — M62838 Other muscle spasm: Secondary | ICD-10-CM | POA: Insufficient documentation

## 2013-03-24 DIAGNOSIS — F1021 Alcohol dependence, in remission: Secondary | ICD-10-CM | POA: Insufficient documentation

## 2013-03-24 DIAGNOSIS — F17200 Nicotine dependence, unspecified, uncomplicated: Secondary | ICD-10-CM | POA: Insufficient documentation

## 2013-03-24 MED ORDER — CYCLOBENZAPRINE HCL 10 MG PO TABS
10.0000 mg | ORAL_TABLET | Freq: Once | ORAL | Status: AC
  Administered 2013-03-24: 10 mg via ORAL
  Filled 2013-03-24: qty 1

## 2013-03-24 MED ORDER — CYCLOBENZAPRINE HCL 10 MG PO TABS
10.0000 mg | ORAL_TABLET | Freq: Once | ORAL | Status: DC
Start: 2013-03-24 — End: 2022-03-28

## 2013-03-24 NOTE — Discharge Instructions (Signed)
Please follow up with your primary care physician in 1-2 days. If you do not have one please call the [facility] number listed above. Please take Flexeril as prescribed, please do not drive on this medication. Please read all discharge instructions and return precautions.    Muscle Cramps and Spasms Muscle cramps and spasms occur when a muscle or muscles tighten and you have no control over this tightening (involuntary muscle contraction). They are a common problem and can develop in any muscle. The most common place is in the calf muscles of the leg. Both muscle cramps and muscle spasms are involuntary muscle contractions, but they also have differences:   Muscle cramps are sporadic and painful. They may last a few seconds to a quarter of an hour. Muscle cramps are often more forceful and last longer than muscle spasms.  Muscle spasms may or may not be painful. They may also last just a few seconds or much longer. CAUSES  It is uncommon for cramps or spasms to be due to a serious underlying problem. In many cases, the cause of cramps or spasms is unknown. Some common causes are:   Overexertion.   Overuse from repetitive motions (doing the same thing over and over).   Remaining in a certain position for a long period of time.   Improper preparation, form, or technique while performing a sport or activity.   Dehydration.   Injury.   Side effects of some medicines.   Abnormally low levels of the salts and ions in your blood (electrolytes), especially potassium and calcium. This could happen if you are taking water pills (diuretics) or you are pregnant.  Some underlying medical problems can make it more likely to develop cramps or spasms. These include, but are not limited to:   Diabetes.   Parkinson disease.   Hormone disorders, such as thyroid problems.   Alcohol abuse.   Diseases specific to muscles, joints, and bones.   Blood vessel disease  where not enough blood is getting to the muscles.  HOME CARE INSTRUCTIONS   Stay well hydrated. Drink enough water and fluids to keep your urine clear or pale yellow.  It may be helpful to massage, stretch, and relax the affected muscle.  For tight or tense muscles, use a warm towel, heating pad, or hot shower water directed to the affected area.  If you are sore or have pain after a cramp or spasm, applying ice to the affected area may relieve discomfort.  Put ice in a plastic bag.  Place a towel between your skin and the bag.  Leave the ice on for 15-20 minutes, 03-04 times a day.  Medicines used to treat a known cause of cramps or spasms may help reduce their frequency or severity. Only take over-the-counter or prescription medicines as directed by your caregiver. SEEK MEDICAL CARE IF:  Your cramps or spasms get more severe, more frequent, or do not improve over time.  MAKE SURE YOU:   Understand these instructions.  Will watch your condition.  Will get help right away if you are not doing well or get worse. Document Released: 07/28/2001 Document Revised: 06/02/2012 Document Reviewed: 01/23/2012 [facility] Patient Information 2014 [place]Care, [place]LL[place].

## 2013-03-24 NOTE — ED Provider Notes (Signed)
CSN: [id number]     Arrival date & time 03/24/13  1358 History   First MD Initiated Contact with Patient 03/24/13 1408    This chart was scribed for Francee PiccoloJennifer Alison Breeding PA-C, a non-physician practitioner working with No att. providers found by Lewanda RifeAlexandra Hurtado, ED Scribe. This patient was seen in room TR08C/TR08C and the patient's care was started at 2:27 PM     Chief Complaint  Patient presents with  . Back Pain   (Consider location/radiation/quality/duration/timing/severity/associated sxs/prior Treatment) The history is provided by the patient. No language interpreter was used.   HPI Comments: Peter PeckDavid L Waller is a 28 y.o. male who presents to the Emergency Department complaining of constant, worsening back pain onset 5 days. Describes pain as sharp and moderate in severity. 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 Reports pain is exacerbated at night. Reports trying ibuprofen with no relief of symptoms. Denies associated fall, recent trauma, chills, weakness, and numbness. Denies urinary or fecal incontinence, urinary retention, perineal/saddle paresthesias, fever, PMHx of cancer, and IV drug use. Denies PMHx back surgeries.     Past Medical History  Diagnosis Date  . Alcohol abuse   . Alcohol abuse    Past Surgical History  Procedure Laterality Date  . Ear tube removal    . Appendectomy     History reviewed. No pertinent family history. History  Substance Use Topics  . Smoking status: Current Every Day Smoker -- 0.50 packs/day  . Smokeless tobacco: Current User  . Alcohol Use: Yes     Comment: Daily     Review of Systems  Musculoskeletal: Positive for back pain.  All other systems reviewed and are negative.    Allergies  Aloe  Home Medications   Current Outpatient Rx  Name  Route  Sig  Dispense  Refill  . Aspirin-Acetaminophen-Caffeine (GOODY HEADACHE PO)   Oral   Take 1 packet by mouth daily as needed (pain).         Marland Kitchen. ibuprofen (ADVIL,MOTRIN) 200 MG tablet  Oral   Take 600 mg by mouth every 6 (six) hours as needed.         . cyclobenzaprine (FLEXERIL) 10 MG tablet   Oral   Take 1 tablet (10 mg total) by mouth once.   10 tablet   0    BP 148/89  Pulse 78  Temp(Src) 98 F (36.7 C) (Oral)  Resp 18  SpO2 97% Physical Exam  Constitutional: He is oriented to person, place, and time. He appears well-developed and well-nourished. No distress.  HENT:  Head: Normocephalic and atraumatic.  Right Ear: External ear normal.  Left Ear: External ear normal.  Nose: Nose normal.  Mouth/Throat: Oropharynx is clear and moist. No oropharyngeal exudate.  Eyes: Conjunctivae and EOM are normal. Pupils are equal, round, and reactive to light.  Neck: Normal range of motion. Neck supple.  Cardiovascular: Normal rate, regular rhythm, normal heart sounds and intact distal pulses.   Pulmonary/Chest: Effort normal and breath sounds normal. No respiratory distress.  Abdominal: Soft. There is no tenderness.  Musculoskeletal: Normal range of motion.       Back:  No midline C-spine, T-spine, or L-spine tenderness with no step-offs or deformities noted   TTP over area documented on graphical documentation    Neurological: He is alert and oriented to person, place, and time. He has normal strength. No cranial nerve deficit or sensory deficit. Gait normal. GCS eye subscore is 4. GCS verbal subscore is 5. GCS motor subscore is 6.  No pronator drift. Bilateral heel-knee-shin intact. 5/5 strength in bilateral lower extremities. Normal gait.   Skin: Skin is warm and dry. He is not diaphoretic.    ED Course  Procedures  COORDINATION OF CARE:  Nursing notes reviewed. Vital signs reviewed. Initial pt interview and examination performed.   2:33 PM-Discussed treatment plan with pt at bedside. Pt agrees with plan.   Treatment plan initiated: Medications  cyclobenzaprine (FLEXERIL) tablet 10 mg (10 mg Oral Given 03/24/13 1440)     Initial diagnostic testing  ordered.    Labs Review Labs Reviewed - No data to display Imaging Review No results found.  EKG Interpretation   None       MDM   1. Muscle spasm of left shoulder     Filed Vitals:   03/24/13 1403  BP: 148/89  Pulse: 78  Temp: 98 F (36.7 C)  Resp: 18    Afebrile, NAD, non-toxic appearing, AAOx4.  Patient with upper back pain d/t spasm.  No neurological deficits and normal neuro exam.  Patient can walk but states is painful.  No loss of bowel or bladder control.  No concern for cauda equina.  No fever, night sweats, weight loss, h/o cancer, IVDU.  RICE protocol and pain medicine indicated and discussed with patient. Patient is stable at time of discharge    I personally performed the services described in this documentation, which was scribed in my presence. The recorded information has been reviewed and is accurate.     Jeannetta Ellis, PA-C 03/24/13 1610

## 2013-03-24 NOTE — ED Notes (Signed)
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

## 2013-03-24 NOTE — ED Notes (Signed)
Patient denies injury.   Patient states starting having back pain last Friday.

## 2013-03-25 NOTE — ED Provider Notes (Signed)
Medical screening examination/treatment/procedure(s) were performed by non-physician practitioner and as supervising physician I was immediately available for consultation/collaboration.  EKG Interpretation   None         Oluwatoyin Banales B. Karlea Mckibbin, MD 03/25/13 1612 

## 2014-04-05 ENCOUNTER — Encounter (HOSPITAL_COMMUNITY): Payer: Self-pay | Admitting: Emergency Medicine

## 2014-04-05 ENCOUNTER — Emergency Department (HOSPITAL_COMMUNITY)
Admission: EM | Admit: 2014-04-05 | Discharge: 2014-04-05 | Disposition: A | Discharge status: Home / Self Care | Payer: Medicaid Other | Attending: Emergency Medicine | Admitting: Emergency Medicine

## 2014-04-05 DIAGNOSIS — R740 Nonspecific elevation of levels of transaminase and lactic acid dehydrogenase [LDH]: Secondary | ICD-10-CM | POA: Insufficient documentation

## 2014-04-05 DIAGNOSIS — Z720 Tobacco use: Secondary | ICD-10-CM | POA: Insufficient documentation

## 2014-04-05 DIAGNOSIS — R112 Nausea with vomiting, unspecified: Secondary | ICD-10-CM | POA: Insufficient documentation

## 2014-04-05 DIAGNOSIS — F1010 Alcohol abuse, uncomplicated: Secondary | ICD-10-CM

## 2014-04-05 DIAGNOSIS — R7401 Elevation of levels of liver transaminase levels: Secondary | ICD-10-CM

## 2014-04-05 LAB — COMPREHENSIVE METABOLIC PANEL
ALBUMIN: 4.6 g/dL (ref 3.5–5.2)
ALT: 79 U/L — ABNORMAL HIGH (ref 0–53)
ANION GAP: 14 (ref 5–15)
AST: 118 U/L — ABNORMAL HIGH (ref 0–37)
Alkaline Phosphatase: 58 U/L (ref 39–117)
BUN: 14 mg/dL (ref 6–23)
CO2: 27 mmol/L (ref 19–32)
Calcium: 9.6 mg/dL (ref 8.4–10.5)
Chloride: 95 mmol/L — ABNORMAL LOW (ref 96–112)
Creatinine, Ser: 0.88 mg/dL (ref 0.50–1.35)
GFR calc Af Amer: >90 mL/min (ref >90)
GLUCOSE: 102 mg/dL — AB (ref 70–99)
POTASSIUM: 4.2 mmol/L (ref 3.5–5.1)
Sodium: 136 mmol/L (ref 135–145)
TOTAL PROTEIN: 8.1 g/dL (ref 6.0–8.3)
Total Bilirubin: 2.3 mg/dL — ABNORMAL HIGH (ref 0.3–1.2)

## 2014-04-05 LAB — CBC WITH DIFFERENTIAL/PLATELET
Basophils Absolute: 0 10*3/uL (ref 0.0–0.1)
Basophils Relative: 1 % (ref 0–1)
Eosinophils Absolute: 0.1 10*3/uL (ref 0.0–0.7)
Eosinophils Relative: 1 % (ref 0–5)
HCT: 45.9 % (ref 39.0–52.0)
Hemoglobin: 15.2 g/dL (ref 13.0–17.0)
LYMPHS ABS: 1.2 10*3/uL (ref 0.7–4.0)
Lymphocytes Relative: 20 % (ref 12–46)
MCH: 35.2 pg — AB (ref 26.0–34.0)
MCHC: 33.1 g/dL (ref 30.0–36.0)
MCV: 106.3 fL — AB (ref 78.0–100.0)
MONO ABS: 0.8 10*3/uL (ref 0.1–1.0)
MONOS PCT: 13 % — AB (ref 3–12)
Neutro Abs: 3.8 10*3/uL (ref 1.7–7.7)
Neutrophils Relative %: 65 % (ref 43–77)
Platelets: 179 10*3/uL (ref 150–400)
RBC: 4.32 MIL/uL (ref 4.22–5.81)
RDW: 13.7 % (ref 11.5–15.5)
WBC: 5.8 10*3/uL (ref 4.0–10.5)

## 2014-04-05 LAB — LIPASE, BLOOD: LIPASE: 34 U/L (ref 11–59)

## 2014-04-05 MED ORDER — ONDANSETRON HCL 4 MG/2ML IJ SOLN
4.0000 mg | Freq: Once | INTRAMUSCULAR | Status: AC
Start: 2014-04-05 — End: 2014-04-05
  Administered 2014-04-05: 4 mg via INTRAVENOUS
  Filled 2014-04-05: qty 2

## 2014-04-05 MED ORDER — ONDANSETRON HCL 4 MG/2ML IJ SOLN
4.0000 mg | Freq: Once | INTRAMUSCULAR | Status: AC
  Administered 2014-04-05: 4 mg via INTRAVENOUS
  Filled 2014-04-05: qty 2

## 2014-04-05 MED ORDER — SODIUM CHLORIDE 0.9 % IV BOLUS (SEPSIS)
1000.0000 mL | Freq: Once | INTRAVENOUS | Status: AC
  Administered 2014-04-05: 1000 mL via INTRAVENOUS

## 2014-04-05 MED ORDER — LORAZEPAM 1 MG PO TABS: 1.0000 mg | ORAL_TABLET | Freq: Four times a day (QID) | ORAL | Status: DC | PRN

## 2014-04-05 NOTE — Discharge Instructions (Signed)
Do not hesitate to return to the emergency room for any new, worsening or concerning symptoms.  Please obtain primary care using resource guide below. But the minute you were seen in the emergency room and that they will need to obtain records for further outpatient management.  Push fluids: take small frequent sips of water or Gatorade, do not drink any soda, juice or caffeinated beverages.    Slowly resume solid diet as desired. Avoid food that are spicy, contain dairy and/or have high fat content.  Aviod NSAIDs (aspirin, motrin, ibuprofen, naproxen, Aleve et Karie Sodacetera) for pain control because they will irritate your stomach.   Emergency Department Resource Guide 1) Find a Doctor and Pay Out of Pocket Although you won't have to find out who is covered by your insurance plan, it is a good idea to ask around and get recommendations. You will then need to call the office and see if the doctor you have chosen will accept you as a new patient and what types of options they offer for patients who are self-pay. Some doctors offer discounts or will set up payment plans for their patients who do not have insurance, but you will need to ask so you aren't surprised when you get to your appointment.  2) Contact Your Local Health Department Not all health departments have doctors that can see patients for sick visits, but many do, so it is worth a call to see if yours does. If you don't know where your local health department is, you can check in your phone book. The CDC also has a tool to help you locate your state's health department, and many state websites also have listings of all of their local health departments.  3) Find a Walk-in Clinic If your illness is not likely to be very severe or complicated, you may want to try a walk in clinic. These are popping up all over the country in pharmacies, drugstores, and shopping centers. They're usually staffed by nurse practitioners or physician assistants that  have been trained to treat common illnesses and complaints. They're usually fairly quick and inexpensive. However, if you have serious medical issues or chronic medical problems, these are probably not your best option.  No Primary Care Doctor: - Call Health Connect at  [phone] - they can help you locate a primary care doctor that  accepts your insurance, provides certain services, etc. - Physician Referral Service- [phone]  Chronic Pain Problems: Organization         Address  Phone   Notes  [facility]  [phone] Patients need to be referred by their primary care doctor.   Medication Assistance: Organization         Address  Phone   Notes  [facility] [address] [place], [place] [phone] --Must be a resident of [place] -- Must have NO insurance coverage whatsoever (no Medicaid/ Medicare, etc.) -- The pt. MUST have a primary care doctor that directs their care regularly and follows them in the community   MedAssist  [phone]   [employer]  [phone]    Agencies that provide inexpensive medical care: Organization         Address  Phone   Notes  [facility]  [phone]   [facility]    [phone]   [facility] [address] [place], [place] [address] [phone]  [facility] [address] [place] [place] [phone]   Planned Parenthood    [phone]   [facility]    [phone]   [facility]  [address], [place] Phone:  [phone], Fax:  [phone] Hours of Operation:  9 am - 6 pm, M-F.  Also accepts Medicaid/Medicare and self-pay.  [facility]  [address], [place] Phone: [phone], Fax: [phone]. Hours of Operation:  8:30 am - 5:30 pm, M-F.  Also accepts Medicaid and  self-pay.  [facility] [place] [address], [place] Phone: [phone]   [facility] [address] Natasha Bence [place], [place] [phone], Ext. [phone] Mondays & Thursdays: 7-9 AM.  First 15 patients are seen on a first come, first serve basis.    Medicaid-accepting [facility] Providers:  Organization         Address  Phone   Notes  [facility] [address], [place] [phone] Also accepts self-pay patients.  [facility] [address] Laurell Josephs [place], [place]  [phone]   [facility] [address], [place] [phone]   [facility] [address] [place] [phone]   Renaye Rakers [address], [place]   [phone] Only accepts [place] Access [place] patients after they have their name applied to their card.   Self-Pay (no insurance) in [facility]:  Organization         Address  Phone   Notes  Sickle Cell Patients, [facility] [address] [place], [place] [phone]   [facility] [address] [place], [place] [phone]   [facility]  [address], [place] [phone]   [facility]/Dr. Osei-Bonsu  [address] [place] or [address], [place] [phone] Phone number for both [place] and [place] locations is the same.  [facility] [address] [place] [phone]   [facility] [address], [place] or [address] [phone]   [facility] [address] [place] [phone], phone; [phone], fax Sees patients 1st and 3rd Saturday of every month.  Must not qualify for public or private insurance (i.e. Medicaid, Medicare, [place] Health Choice, Veterans' Benefits)  Household income  should be no more than 200% of the poverty level The clinic cannot treat you if you are pregnant or think you are pregnant  Sexually transmitted diseases are not treated at the clinic.    Dental Care: Organization         Address  Phone  Notes  [facility] [address] [place], [place] [phone] Accepts children up to age 74 who are enrolled in [place] or [place] Health Choice; pregnant women with a Medicaid card; and children who have applied for Medicaid or [place] Health Choice, but were declined, whose parents can pay a reduced fee at time of service.  [facility] Department of [facility]  [address], [place] [phone] Accepts children up to age 54 who are enrolled in [place] or [place] Health Choice; pregnant women with a Medicaid card; and  Medicaid card; and children who have applied for Medicaid or [place] Health Choice, but were declined, whose parents can pay a reduced fee at time of service.  °[facility] ° [address], [place] [phone] Patients are seen by appointment only. Walk-ins are not accepted. [facility] will see patients 18 years of age and older. °Monday - Tuesday (8am-5pm) °Most Wednesdays (8:30-5pm) °$30 per visit, cash only  °[facility] ° [address], [place] [phone] Patients are seen by appointment only. Walk-ins are not accepted. [facility] will see patients 18 years of age and older. °One Wednesday Evening (Monthly: Volunteer Based).  $30 per visit, cash only  °[facility]  [phone] for adults; Children under age 4, call Graduate Pediatric Dentistry at [phone]. Children aged 4-14, please call [phone] to request a pediatric application. ° Dental services are provided in all areas of dental care including fillings, crowns and bridges, complete and partial dentures, implants, gum  treatment, root canals, and extractions. Preventive care is also provided. Treatment is provided to both adults and children. °Patients are selected via a lottery and there is often a waiting list. °  °[facility] [address], °[place] ° [phone] [email or web address] °  °[facility] [address], [place], [place] [phone], Ext. [phone] Second and Fourth Thursday of each month, opens at 6:30 AM; Clinic ends at 9 AM.  Patients are seen on a first-come first-served basis, and a limited number are seen during each clinic.  ° °[facility] ° [address], [place], [place] [phone]   Eligibility Requirements °You must have lived in [place], [place], or [place] counties for at least the last three months. °  You cannot be eligible for state or federal sponsored healthcare insurance, including Veterans Administration, Medicaid, or Medicare. °  You generally cannot be eligible for healthcare insurance through your employer.  °  How to apply: °Eligibility screenings are held every Tuesday and Wednesday afternoon from 1:00 pm until 4:00 pm. You do not need an appointment for the interview!  °[facility] [address], [place], [place] [phone]   °[facility]  [phone]   °[facility]  [phone]   °[facility]  [phone]   ° °Behavioral Health Resources in the Community: °Intensive Outpatient Programs °Organization         Address  Phone  Notes  °[facility] [address], [place], [place] [phone]   °[facility] [address], [place], [place] [phone]   °[facility] [address], [place], [place] ° [phone]   °[facility] [address],  °[place], [place] 1-800-853-5163 or 336-641-4981   °Substance Abuse Resources °Organization         Address  Phone  Notes  °Al[facility]  Drug Services  [phone]   °[facility]  [phone]   °[facility]  336-285-9073   °[facility]  [phone]   °Residential & Outpatient Substance Abuse Program  1-800-659-3381   °Psychological Services °Organization         Address  Phone  Notes  °[facility]  336- [phone]   °[facility]  336- [phone]   °[facility] [address], [place] 1-800-853-5163 or 336-641-4981   ° °Mobile Crisis Teams °Organization           Address  Phone  Notes  [facility], Mobile Crisis Care Unit  ([phone]   [facility]  [address] [place], [place]   [facility] [address] [place] [phone]    Self-Help/Support Groups Organization         Address  Phone             Notes  [place]. of [place] - variety of support groups  [place] Call for more information  Narcotics Anonymous (NA), [facility] [address], [employer] [place]  2 meetings at this location   Special educational needs teacher         Address  Phone  Notes  [facility] [place],    [place]  [phone]   [facility]  [address], [place] [address], [place], [place]   [place], [place] [phone] Admissions: 8am-3pm M-F  Incentives Substance [place] [address]    [place], [place] [phone]   [facility] [address] [place], [place], [place]   [facility] [address]  [place], [place]   [facility] - Intensive Outpatient [address] Kristeen [address], [place], [place]   [facility] ([place] [place].,  [place], [place] 1-7316549183 or [phone]   [facility] ([facility]) [address], [place], [place] Accepts Medicaid  [facility] [address],  [place] 1-281-729-8089 Substance Abuse/Addiction Treatment   [facility] Organization         Address  Phone  Notes  [facility]  6407101468   Domenic Schwab, PhD [address] Arlis Porta [place], [place]   [phone] or [phone]   [place], [place] [phone]   [facility] [address] [place], [place] [phone] Insurance/Medicaid/sponsorship through [facility] and Families [address]                                    [place], [place] [phone] [place] [address] [place], [place] [phone]    Dr. Adele Schilder  [phone]   [facility] Dept. 1) [address], [place] 2) [place] 3)  [address], [place] [phone]  [phone]   [place] [phone] or [phone] (After Hours)

## 2014-04-05 NOTE — ED Notes (Signed)
Provided pt with Coke 

## 2014-04-05 NOTE — ED Notes (Signed)
Bed: WA20 Expected date:  Expected time:  Means of arrival:  Comments: 29 y/o M vomiting

## 2014-04-05 NOTE — ED Notes (Addendum)
Per EMS, Pt from home, began feeling nauseous and vomited last night and into this morning after eating cookout for dinner. Pt began to feel a little light headed this morning so he called EMS. A&Ox4. Pt also mentioned he wants to detox from alcohol, pt had 12 pack yesterday.

## 2014-04-05 NOTE — ED Provider Notes (Signed)
CSN: [id number]     Arrival date & time 04/05/14  1241 History   First MD Initiated Contact with Patient 04/05/14 1301     Chief Complaint  Patient presents with  . Nausea  . Emesis     (Consider location/radiation/quality/duration/timing/severity/associated sxs/prior Treatment) HPI   Peter PeckDavid L Mcghie is a 29 y.o. male complaining of multiple episodes of nonbloody, nonbilious, coffee-ground emesis onset last night after she ate fast food (other people that ate the fast food has not had any issues). Associated symptoms of lightheadedness, fever(Tmax 102 Fahrenheit), chills, patient states that this is setting off of panic attack. Patient denies diarrhea, fever, chills, abdominal pain. As per EMS patient had requested detox from alcohol, patient declines this today. States that he drinks daily, drinks beer, has never had any seizures or DTs from withdrawal. Last alcohol intake was yesterday.  Past Medical History  Diagnosis Date  . Alcohol abuse   . Alcohol abuse    Past Surgical History  Procedure Laterality Date  . Ear tube removal    . Appendectomy     No family history on file. History  Substance Use Topics  . Smoking status: Current Every Day Smoker -- 0.50 packs/day  . Smokeless tobacco: Current User  . Alcohol Use: Yes     Comment: Daily     Review of Systems  10 systems reviewed and found to be negative, except as noted in the HPI.   Allergies  Aloe  Home Medications   Prior to Admission medications   Medication Sig Start Date End Date Taking? Authorizing Provider  cyclobenzaprine (FLEXERIL) 10 MG tablet Take 1 tablet (10 mg total) by mouth once. Patient not taking: Reported on 04/05/2014 03/24/13   Victorino DikeJennifer L Piepenbrink, PA-C   BP 102/84 mmHg  Pulse 96  Temp(Src) 97.9 F (36.6 C) (Oral)  Resp 16  SpO2 90% Physical Exam  Constitutional: He is oriented to person, place, and time. He appears well-developed and well-nourished. No distress.  HENT:  Head:  Normocephalic and atraumatic.  Mouth/Throat: Oropharynx is clear and moist.  Eyes: Conjunctivae and EOM are normal. Pupils are equal, round, and reactive to light.  Neck: Normal range of motion. Neck supple.  Cardiovascular: Normal rate, regular rhythm and intact distal pulses.   Pulmonary/Chest: Effort normal and breath sounds normal. No stridor. No respiratory distress. He has no wheezes. He has no rales. He exhibits no tenderness.  Abdominal: Soft. Bowel sounds are normal. He exhibits no distension and no mass. There is no tenderness. There is no rebound and no guarding.  Musculoskeletal: Normal range of motion.  Neurological: He is alert and oriented to person, place, and time.  No Tongue fasciculations  Psychiatric: He has a normal mood and affect.  Nursing note and vitals reviewed.   ED Course  Procedures (including critical care time) Labs Review Labs Reviewed  CBC WITH DIFFERENTIAL/PLATELET - Abnormal; Notable for the following:    MCV 106.3 (*)    MCH 35.2 (*)    Monocytes Relative 13 (*)    All other components within normal limits  COMPREHENSIVE METABOLIC PANEL - Abnormal; Notable for the following:    Chloride 95 (*)    Glucose, Bld 102 (*)    AST 118 (*)    ALT 79 (*)    Total Bilirubin 2.3 (*)    All other components within normal limits  LIPASE, BLOOD    Imaging Review No results found.   EKG Interpretation None  MDM   Final diagnoses:  Non-intractable vomiting with nausea, vomiting of unspecified type  Transaminitis  Alcohol abuse    Filed Vitals:   04/05/14 1242 04/05/14 1257 04/05/14 1508  BP:  102/84 152/84  Pulse:  96 84  Temp:  97.9 F (36.6 C) 98.2 F (36.8 C)  TempSrc:  Oral Oral  Resp:  16 16  SpO2: 99% 90% 96%    Medications  ondansetron (ZOFRAN) injection 4 mg (4 mg Intravenous Given 04/05/14 1302)  sodium chloride 0.9 % bolus 1,000 mL (0 mLs Intravenous Stopped 04/05/14 1509)  ondansetron (ZOFRAN) injection 4 mg (4 mg  Intravenous Given 04/05/14 1404)    Peter Waller is a pleasant 28 y.o. male presenting with past medical history significant for alcohol abuse presenting with multiple episodes of nonbloody, nonbilious, coffee-ground emesis. Patient is not showing signs of alcohol withdrawal. He has significant improvement with just fluids and Zofran. He is tolerating by mouth and amenable for discharge. States that he would like to quit drinking alcohol but declines inpatient treatment. I will write him for Ativan for nausea and withdrawal symptoms. We've had an extensive discussion of return precautions. Patient is girlfriend both verbalized understanding and are encouraged to return for any signs of DTs.  Evaluation does not show pathology that would require ongoing emergent intervention or inpatient treatment. Pt is hemodynamically stable and mentating appropriately. Discussed findings and plan with patient/guardian, who agrees with care plan. All questions answered. Return precautions discussed and outpatient follow up given.   Discharge Medication List as of 04/05/2014  3:05 PM    START taking these medications   Details  LORazepam (ATIVAN) 1 MG tablet Take 1 tablet (1 mg total) by mouth every 6 (six) hours as needed (Nausea/Anxiety)., Starting 04/05/2014, Until Discontinued, Print            Wynetta Emery, PA-C 04/05/14 1550  Suzi Roots, MD 04/06/14 [phone]

## 2014-05-05 ENCOUNTER — Telehealth: Payer: Self-pay | Admitting: Primary Care

## 2014-05-05 NOTE — Telephone Encounter (Signed)
Barry Cruz has a New Patient physical on 05/14/14. Do you want any labs done before this physical?

## 2014-05-06 NOTE — Telephone Encounter (Signed)
Did not order any labs will decide at the physical

## 2014-05-06 NOTE — Telephone Encounter (Signed)
Pt.notified

## 2014-05-14 ENCOUNTER — Ambulatory Visit: Payer: Self-pay | Admitting: Primary Care

## 2014-05-14 ENCOUNTER — Encounter: Payer: Self-pay | Admitting: Primary Care

## 2014-05-14 VITALS — BP 126/80 | HR 89 | Temp 97.4°F | Ht 69.25 in | Wt 219.4 lb

## 2014-05-14 DIAGNOSIS — F17200 Nicotine dependence, unspecified, uncomplicated: Secondary | ICD-10-CM

## 2014-05-14 DIAGNOSIS — Z0000 Encounter for general adult medical examination without abnormal findings: Secondary | ICD-10-CM

## 2014-05-14 DIAGNOSIS — F419 Anxiety disorder, unspecified: Secondary | ICD-10-CM

## 2014-05-14 LAB — COMPREHENSIVE METABOLIC PANEL
ALT: 26 U/L (ref 0–50)
AST: 29 U/L (ref 0–50)
Albumin: 4.8 g/dL (ref 3.5–5.2)
Alk Phos: 81 U/L (ref 40–130)
Anion Gap: 15 (ref 7–16)
Bilirubin,Total: 0.5 mg/dL (ref 0.0–1.2)
CO2: 27 mmol/L (ref 20–28)
Calcium: 9.6 mg/dL (ref 9.0–10.3)
Chloride: 98 mmol/L (ref 96–108)
Creatinine: 1.34 mg/dL — ABNORMAL HIGH (ref 0.67–1.17)
GFR,Black: 82 *
GFR,Caucasian: 71 *
Glucose: 91 mg/dL (ref 60–99)
Lab: 17 mg/dL (ref 6–20)
Potassium: 4.6 mmol/L (ref 3.3–5.1)
Sodium: 140 mmol/L (ref 133–145)
Total Protein: 7.6 g/dL (ref 6.3–7.7)

## 2014-05-14 LAB — LIPID PANEL
Chol/HDL Ratio: 2.7
Cholesterol: 154 mg/dL
HDL: 57 mg/dL
LDL Calculated: 88 mg/dL
Non HDL Cholesterol: 97 mg/dL
Triglycerides: 45 mg/dL

## 2014-05-14 MED ORDER — BUPROPION HCL (SMOKING DETER) 150 MG PO TB12 *A*
ORAL_TABLET | ORAL | Status: DC
Start: 2014-05-14 — End: 2014-07-31

## 2014-05-14 NOTE — Progress Notes (Signed)
Reason For Visit   Adult Comprehensive Physical Exam.    HPI   Today has concerns of:    Over the past 2-3 years have noticed he is less social. Feel very uncomfortable in social, public setting, cannot focus on a conversation.  Don't feel comfortable at the mall. Trying to expose himself to social settings more, makes things a bit easier. Mood is up and down. Some lack of interest in things he does before.      No alcohol in last year.      Nicotine dependence - has used gum before.       Entire chart (including Problem List, SH, FH, Immunizations, Medications, and Allergies) was reviewed with the patient today during the appointment and updates were made as appropriate.    No Known Allergies (drug, envir, food or latex)  Current Outpatient Prescriptions   Medication Sig Dispense Refill    buPROPion (ZYBAN) 150 MG 12 hr tablet 150 mg daily for 3 days, then 150 mg twice per day 60 tablet 1     No current facility-administered medications for this visit.     Patient Active Problem List    Diagnosis Date Noted    Anxiety 05/14/2014    Nicotine dependence 05/14/2014     No past medical history on file.  No past surgical history on file.  Family History   Problem Relation Age of Onset    Other Father      heavy smoker    Emphysema Paternal Grandfather      History     Social History    Marital Status: Married     Spouse Name: N/A     Number of Children: N/A    Years of Education: N/A     Social History Main Topics    Smoking status: Current Every Day Smoker -- 0.30 packs/day for 7 years    Smokeless tobacco: None    Alcohol Use: No    Drug Use: None    Sexual Activity:     Partners: Female     Other Topics Concern    None     Social History Narrative    Full time Radio broadcast assistant - work at [employer]         ROS   Systemic symptoms: Not feeling tired (fatigue).  No fever, no chills, and no recent weight change.   Head symptoms: No headache.  Eye symptoms: No vision problems.  Otolaryngeal  symptoms: No hearing loss, no earache, and no tinnitus.  No nasal symptoms  and no sore throat. Except for resolving URI.   Cardiovascular symptoms: No chest pain or discomfort, no palpitations, no leg swelling and no intermittent leg claudication.  Pulmonary symptoms: No dyspnea, no cough, and no wheezing.  Gastrointestinal symptoms: Normal appetite, no heartburn, no nausea, no vomiting, no abdominal pain, no change in stool, no diarrhea, and no constipation.  Genitourinary symptoms: No hematuria, urinary frequency, urgency or dysuria.  Hematologic symptoms: No easy bleeding  and no tendency for easy bruising.  Musculoskeletal symptoms: Lower back pain, pulled muscle 2012/2013, occurs rarely  and no myalgias.  No localized joint pain  and no localized joint swelling.  Neurological symptoms: No dizziness, no lightheadedness, no fainting, no memory lapses or loss, no motor disturbances, and no sensory disturbances.  Psychological symptoms: see above  Skin symptoms: No pruritus and no rash.      There is no immunization history on file for this patient.  BP 126/80 mmHg   Pulse 89   Temp(Src) 36.3 C (97.4 F) (Temporal)   Ht 1.759 m (5' 9.25")   Wt 99.519 kg (219 lb 6.4 oz)   BMI 32.16 kg/m2   SpO2 96%    Physical Exam   GENERAL: Alert, in no apparent distress, good attention, well groomed.  Normal Habitus.  Appears stated age   SKIN: No petechiae, no jaundice, no rash or other skin lesions  EYES: Anicteric, no exophthalmos.  Lids not drooping.  Sclera normal.  EARS: Normal pinnae.  External auditory canal intact, clear, and without lesions.  Normal tympanic membrane with light reflex and landmarks.  Acuity to conversational tones is good.  NOSE: Normal pink mucosa without swollen turbinates.  Septum appears midline.  MOUTH: Moist mucous membranes, normal pharynx, palate symmetrical, no thrush, normal tongue, no tonsillar exudate.  Gums pink.  NECK: Nontender spinous processes.  Full ROM grossly.  THYROID: Normal  size, nontender, no masses.  LYMPH:  no cervical or supraclavicular lymphadenopathy.  CHEST: Clear to auscultation bilaterally with good air movement and no wheezes or rales.  No accessory muscle usage.  Respirations unlabored.  Chest wall movement symmetric.  CARDIAC: Regular without gallops or rubs;  no heaves, no thrills;  no murmurs.  VASCULAR: 2+ bilateral dorsal pedis pulses, 2+ bilateral posterior tibialis pulses. No carotid bruit, no aortic bruit, no renal bruit, no supraclavicular bruit.  Capillary refill within 2 seconds.  No edema.  ABDOMEN: Soft, nontender, nondistended, good bowel sounds, no overt hepatosplenomegaly, no pulsatile mass or detectable aortic widening, no guarding, no rebound.  Bladder without fullness or tenderness.  BACK: No CVA tenderness, no spinous process tenderness, no scoliosis  EXTREMITIES: No clubbing, no synovitis.  No obvious joint deformities.  Muscle tone and gross strength assessment appear normal for age, without atrophy.  RECTUM: defer  NEUROLOGIC EXAM: Normal speech, normal sensation to light touch, no clonus, negative straight leg raises.  2+ bilateral patella DTR, 2+ Achilles bilateral DTR.  Gait coordinated and smooth.  Observed dexterity without tremor.  Recall of recent and remote events appears normal.  NG:[id number] penis, distended testicles without masses    No results found for: WBC, HGB, HCT, MCV, PLT    Chemistry    No results for input(s): NA, K, CL, CO2, GFRC, GFRB, UN, CREAT in the last 8760 hours. No results for input(s): GLU, CA, TP, ALB, ALT, AST, ALK, TB in the last 8760 hours.       No results found for: CHOL, HDL, LDLC, TRIG, CHHDC  No results found for: TSH       Assessment   Patient comes in today for a physical exam and the following issues were identified:    1. Routine general medical examination at a health care facility  HIV 1&2 antigen/antibody    Comprehensive metabolic panel    Lipid add Rfx to Drt LDL if Trig >400   2. Anxiety  Have given  names for therapists, also zyban may help 3 month f/u   3. Nicotine dependence  zyban prescribed given information about how to use and side effects        Health Maintenance   Topic Date Due    HIV TESTING OFFERED  12/16/1997    IMM-INFLUENZA  Addressed       Counseling and Education    -- Diet reviewed and discussed  -- Body Weight reviewed and discussed         Body mass index is 32.16 kg/(m^2).   --  Aerobic exercise discussed and encouraged a minimum of 30 minutes of moderate-intensity exercise on five days each week, as well as strength training, flexibility, and balance exercises  -- Tobacco use discussed  -- Alcohol recommendations discussed including limiting intake to >/= 14 standard drinks per week (<65), >/= 7 standard drinks per week (>/= 65)  -- Cholesterol screening discussed NA  -- Aspirin for Primary Prevention   -- Diabetes Screening NA  -- AAA screening U/S for men aged 70-75 who have ever smoked NA  -- DEXA SCAN assessment (risk factors) NA  -- STD risk counseling, safe sex, contraception discussed, HIV test   -- Hepatitis C screening for persons born [birth year] to 1966   -- Routine eye and dental care discussed.  -- Motor vehicle/ sports safety discussed.  -- Working smoke/ TEFL teacher discussed.      Immunizations  -- Immunizations reviewed         >> Yearly Influenza Shot Advised          >>Pneumovax 23 (>65, Immunocompromising condition, chronic heart, lung, liver disease, alcoholism, , smoker, DM, long term care residents)  - NA         >>PCV13 (> 65, 19-64 with Immunocompromising condition (including CKD 4 or greater), functional/anatomic asplenia, CSF leak, cochlear implant) NA         >>Zostavax (>60) NA         >>Td/Tdap (every 10 years, 1 time Tdap) due 2022         >> MMR (born after [birth year])          >>Hep B (higher risk sexually active adults, health care workers, DM, chronic liver disease, ESRD)         >>Hep A (travel, chronic liver disease, considering foreign adoption, high risk sexual  behavior)     Cancer Screening  -- Colon CA screening discussed (age 21-75 recommended, age 46-85 possibly recommended) at 17  -- Prostate cancer screening will discuss at 50  -- Lung cancer screening discussed (LDCT: smokers/ex-smokers 22-74 yo, + 30 pk-yrs, smoking or quit <15 yrs ago- annually)  -- Skin CA awareness/prevention discussed    Follow up: 3 months    Author: Perrin Maltese, MD  Note created: 05/14/2014  at: 8:45 AM

## 2014-05-14 NOTE — Addendum Note (Signed)
Addended by: Izora RibasMCCORMICK, Ommie Degeorge ROSE on: 05/14/2014 08:49 AM     Modules accepted: Orders

## 2014-05-14 NOTE — Patient Instructions (Signed)
Bupropion (byoo PROE pee on)    Brand Names: [place] Aplenzin; Budeprion SR [DSC]; Buproban; Forfivo XL; Wellbutrin; Wellbutrin SR; Wellbutrin XL; Zyban   Brand Names: [place] Bupropion SR; Mylan-Bupropion XL; Novo-Bupropion SR; PMS-Bupropion SR; ratio-Bupropion SR; Sandoz-Bupropion SR; Wellbutrin SR; Wellbutrin XL; Zyban   Warning   · Children and teens who take this drug may be at a greater risk of having thoughts or actions of suicide. Adults may also be at risk. The risk may be greater in people who have had these thoughts or actions in the past. Watch people who take this drug closely. Call the doctor right away if signs like low mood (depression), nervousness, restlessness, grouchiness, panic attacks, or changes in mood or actions are new or worse. Call the doctor right away if any thoughts or actions of suicide occur.  · Not all products are approved for use to help stop smoking. Talk with the doctor to make sure that you have the right product.  · When used to stop smoking this drug may cause or make diseases of the mind worse. Taking one's own life, ideas of killing yourself or murder, low mood (depression), forceful actions, hallucinations, and psychoses have happened with use. If you think you have any of these problems, call your doctor right away.  What is this drug used for?   · It is used to treat low mood (depression).  · It is used to help you stop smoking.  · It is used to treat seasonal affective disorder (SAD).  · It may be given to you for other reasons. Talk with the doctor.  What do I need to tell my doctor BEFORE I take this drug?   · If you have an allergy to bupropion or any other part of this drug.  · If you are allergic to any drugs like this one, any other drugs, foods, or other substances. Tell your doctor about the allergy and what signs you had, like rash; hives; itching; shortness of breath; wheezing; cough; swelling of face, lips, tongue, or throat; or any other signs.  · If you  have ever had seizures.  · If you drink a lot of alcohol and you stop drinking all of a sudden.  · If you use certain other drugs like drugs for seizures or anxiety and you stop using them all of a sudden.  · If you have taken certain drugs used for low mood (depression) like isocarboxazid, phenelzine, or tranylcypromine or drugs used for Parkinson's disease like selegiline or rasagiline in the last 14 days. Taking this drug within 14 days of those drugs can cause very bad high blood pressure. Talk with your doctor.  · If you are taking any of these drugs: Linezolid or methylene blue.  · If you are taking another drug that has the same drug in it.  · This is not a list of all drugs or health problems that interact with this drug.  · Tell your doctor and pharmacist about all of your drugs (prescription or OTC, natural products, vitamins) and health problems. You must check to make sure that it is safe for you to take this drug with all of your drugs and health problems. Do not start, stop, or change the dose of any drug without checking with your doctor.  What are some things I need to know or do while I take this drug?   · All products:  · Tell dentists, surgeons, and other doctors that you use this drug.  ·   Avoid driving and doing other tasks or actions that call for you to be alert or have clear eyesight until you see how this drug affects you.  · This drug may affect certain lab tests. Be sure your doctor and lab workers know you take this drug.  · Do not stop taking this drug all of a sudden without calling your doctor. You may have a greater risk of side effects. If you need to stop this drug, you will want to slowly stop it as ordered by your doctor.  · This drug may cause high blood pressure.  · Have your blood pressure checked often. Talk with your doctor.  · This drug may raise the chance of seizures. The chance may be higher in people who have certain health problems, use certain other drugs, or drink a  lot of alcohol. Talk to your doctor to see if you have a greater chance of seizures while taking this drug.  · Avoid drinking alcohol.  · Talk with your doctor before you use other drugs and natural products that slow your actions.  · It may take several weeks to see the full effects.  · Some people may have a higher chance of eye problems with this drug. Your doctor may want you to have an eye exam to see if you have a higher chance of these eye problems. Call your doctor right away if you have eye pain, change in eyesight, or swelling or redness in or around the eye.  · If you are 65 or older, use this drug with care. You could have more side effects.  · Tell your doctor if you are pregnant or plan on getting pregnant. You will need to talk about the benefits and risks of using this drug while you are pregnant.  · Tell your doctor if you are breast-feeding. You will need to talk about any risks to your baby.  · Zyban:  · If you have not been able to quit smoking after taking this drug for 12 weeks, talk with your doctor.  What are some side effects that I need to call my doctor about right away?   · WARNING/CAUTION: Even though it may be rare, some people may have very bad and sometimes deadly side effects when taking a drug. Tell your doctor or get medical help right away if you have any of the following signs or symptoms that may be related to a very bad side effect:  · Signs of an allergic reaction, like rash; hives; itching; red, swollen, blistered, or peeling skin with or without fever; wheezing; tightness in the chest or throat; trouble breathing or talking; unusual hoarseness; or swelling of the mouth, face, lips, tongue, or throat.  · Signs of liver problems like dark urine, feeling tired, not hungry, upset stomach or stomach pain, light-colored stools, throwing up, or yellow skin or eyes.  · Signs of low mood (depression), thoughts of killing yourself, nervousness, emotional ups and downs, thinking that  is not normal, anxiety, or lack of interest in life.  · Change in how you act.  · Change in thinking clearly and with logic.  · Hallucinations.  · Restlessness.  · Very bad headache.  · Very nervous and excitable.  · If seizures are new or worse after starting this drug.  · A big weight gain or loss.  · Chest pain or pressure.  · A heartbeat that does not feel normal.  · Ringing in ears.  · Passing   urine more often.  · Swollen gland.  · Trouble moving around.  · Very bad muscle or joint pain.  · A very bad skin reaction (Stevens-Johnson syndrome/toxic epidermal necrolysis) may happen. It can cause very bad health problems that may not go away, and sometimes death. Get medical help right away if you have signs like red, swollen, blistered, or peeling skin (with or without fever); red or irritated eyes; or sores in your mouth, throat, nose, or eyes.  What are some other side effects of this drug?   · All drugs may cause side effects. However, many people have no side effects or only have minor side effects. Call your doctor or get medical help if any of these side effects or any other side effects bother you or do not go away:  · All products:  · Dizziness.  · Feeling sleepy.  · Headache.  · Belly pain.  · Shakiness.  · Feeling nervous and excitable.  · Bad dreams.  · Upset stomach or throwing up.  · Hard stools (constipation).  · Dry mouth.  · Loose stools (diarrhea).  · Not able to sleep.  · Muscle pain.  · Nose and throat irritation.  · Sweating a lot.  · Not hungry.  · A change in weight without trying.  · Extended-release tablets:  · You may see the tablet shell in your stool.  · These are not all of the side effects that may occur. If you have questions about side effects, call your doctor. Call your doctor for medical advice about side effects.  · You may report side effects to your national health agency.  How is this drug best taken?   · Use this drug as ordered by your doctor. Read all information given to  you. Follow all instructions closely.  · All products:  · To gain the most benefit, do not miss doses.  · Take as you have been told, even if you feel well.  · Do not take this drug more often than you are told. This may raise the risk of seizures. Be sure you know how far apart to take your doses.  · Take this drug at the same time of day.  · Take with or without food. Take with food if it causes an upset stomach.  · If you are not able to sleep, do not take this drug too close to bedtime. Talk with your doctor.  · Swallow whole. Do not chew, break, or crush.  · Zyban:  · You may take this drug for 1 week before you stop smoking.  · Nicotine products and counseling may be used at the same time for best results.  What do I do if I miss a dose?   · Skip the missed dose and go back to your normal time.  · Do not take 2 doses at the same time or extra doses.  How do I store and/or throw out this drug?   · Store at room temperature.  · Protect from light.  · Store in a dry place. Do not store in a bathroom.  · Keep all drugs out of the reach of children and pets.  · Check with your pharmacist about how to throw out unused drugs.  General drug facts   · If your symptoms or health problems do not get better or if they become worse, call your doctor.  · Do not share your drugs with others and do not take anyone   else's drugs.  · Keep a list of all your drugs (prescription, natural products, vitamins, OTC) with you. Give this list to your doctor.  · Talk with the doctor before starting any new drug, including prescription or OTC, natural products, or vitamins.  · Some drugs may have another patient information leaflet. If you have any questions about this drug, please talk with your doctor, pharmacist, or other health care provider.  · If you think there has been an overdose, call your poison control center or get medical care right away. Be ready to tell or show what was taken, how much, and when it happened.  Consumer  Information Use and Disclaimer   · This information should not be used to decide whether or not to take this medicine or any other medicine. Only the healthcare provider has the knowledge and training to decide which medicines are right for a specific patient. This information does not endorse any medicine as safe, effective, or approved for treating any patient or health condition. This is only a brief summary of general information about this medicine. It does NOT include all information about the possible uses, directions, warnings, precautions, interactions, adverse effects, or risks that may apply to this medicine. This information is not specific medical advice and does not replace information you receive from the healthcare provider. You must talk with the healthcare provider for complete information about the risks and benefits of using this medicine.  Last Reviewed Date   2013-03-16  Copyright   · © 2015 [employer] and its affiliates and/or licensors. All rights reserved.  ·

## 2014-05-15 LAB — HIV 1&2 ANTIGEN/ANTIBODY: HIV 1&2 ANTIGEN/ANTIBODY: NONREACTIVE

## 2014-05-17 ENCOUNTER — Encounter: Payer: Self-pay | Admitting: Primary Care

## 2014-05-17 ENCOUNTER — Telehealth: Payer: Self-pay | Admitting: Primary Care

## 2014-05-17 DIAGNOSIS — R7989 Other specified abnormal findings of blood chemistry: Secondary | ICD-10-CM

## 2014-05-17 NOTE — Telephone Encounter (Signed)
Discussed results, suspect elevated creatinine is related to muscle mass and creatine that he was taking, he has stopped this and will avoid other supplements, recheck with urine test in 1 month.

## 2014-07-22 ENCOUNTER — Ambulatory Visit: Payer: Self-pay | Admitting: Primary Care

## 2014-07-31 ENCOUNTER — Other Ambulatory Visit: Payer: Self-pay | Admitting: Primary Care

## 2014-08-02 NOTE — Telephone Encounter (Signed)
Refill placed have him get labs and f/u app

## 2014-08-04 ENCOUNTER — Other Ambulatory Visit: Payer: Self-pay | Admitting: Primary Care

## 2014-08-04 MED ORDER — BUPROPION HCL 150 MG PO TB12 *I*
150.0000 mg | ORAL_TABLET | Freq: Two times a day (BID) | ORAL | 1 refills | Status: AC
Start: 2014-08-04 — End: ?

## 2014-08-06 NOTE — Telephone Encounter (Signed)
Left v/m for pt relaying Dr Garen Grams message and requested a call back to schedule f/u appt

## 2014-11-15 ENCOUNTER — Encounter: Payer: Self-pay | Admitting: Primary Care

## 2014-11-15 NOTE — Telephone Encounter (Signed)
error 

## 2014-11-22 ENCOUNTER — Encounter: Payer: Self-pay | Admitting: Primary Care

## 2014-11-22 ENCOUNTER — Ambulatory Visit: Payer: Self-pay | Admitting: Primary Care

## 2014-11-25 ENCOUNTER — Ambulatory Visit: Payer: Self-pay | Admitting: Primary Care

## 2015-01-24 ENCOUNTER — Emergency Department (HOSPITAL_COMMUNITY): Payer: Medicaid Other

## 2015-01-24 ENCOUNTER — Encounter (HOSPITAL_COMMUNITY): Payer: Self-pay | Admitting: Emergency Medicine

## 2015-01-24 ENCOUNTER — Emergency Department (HOSPITAL_COMMUNITY)
Admission: EM | Admit: 2015-01-24 | Discharge: 2015-01-24 | Disposition: A | Discharge status: Home / Self Care | Payer: Medicaid Other | Attending: Emergency Medicine | Admitting: Emergency Medicine

## 2015-01-24 DIAGNOSIS — R569 Unspecified convulsions: Secondary | ICD-10-CM | POA: Insufficient documentation

## 2015-01-24 DIAGNOSIS — H6122 Impacted cerumen, left ear: Secondary | ICD-10-CM

## 2015-01-24 DIAGNOSIS — F17210 Nicotine dependence, cigarettes, uncomplicated: Secondary | ICD-10-CM | POA: Insufficient documentation

## 2015-01-24 DIAGNOSIS — H6123 Impacted cerumen, bilateral: Secondary | ICD-10-CM | POA: Insufficient documentation

## 2015-01-24 LAB — CBC WITH DIFFERENTIAL/PLATELET
Basophils Absolute: 0 10*3/uL (ref 0.0–0.1)
Basophils Relative: 0 %
Eosinophils Absolute: 0.1 10*3/uL (ref 0.0–0.7)
Eosinophils Relative: 1 %
HEMATOCRIT: 47.8 % (ref 39.0–52.0)
Hemoglobin: 15.9 g/dL (ref 13.0–17.0)
LYMPHS ABS: 1.7 10*3/uL (ref 0.7–4.0)
LYMPHS PCT: 18 %
MCH: 35.1 pg — AB (ref 26.0–34.0)
MCHC: 33.3 g/dL (ref 30.0–36.0)
MCV: 105.5 fL — AB (ref 78.0–100.0)
MONO ABS: 0.7 10*3/uL (ref 0.1–1.0)
MONOS PCT: 7 %
NEUTROS ABS: 6.8 10*3/uL (ref 1.7–7.7)
Neutrophils Relative %: 74 %
Platelets: 222 10*3/uL (ref 150–400)
RBC: 4.53 MIL/uL (ref 4.22–5.81)
RDW: 13.5 % (ref 11.5–15.5)
WBC: 9.3 10*3/uL (ref 4.0–10.5)

## 2015-01-24 LAB — ETHANOL: Alcohol, Ethyl (B): <5 mg/dL (ref <5)

## 2015-01-24 LAB — COMPREHENSIVE METABOLIC PANEL
ALBUMIN: 4 g/dL (ref 3.5–5.0)
ALT: 17 U/L (ref 17–63)
AST: 25 U/L (ref 15–41)
Alkaline Phosphatase: 42 U/L (ref 38–126)
Anion gap: 7 (ref 5–15)
BUN: 10 mg/dL (ref 6–20)
CHLORIDE: 105 mmol/L (ref 101–111)
CO2: 28 mmol/L (ref 22–32)
CREATININE: 1.02 mg/dL (ref 0.61–1.24)
Calcium: 9.8 mg/dL (ref 8.9–10.3)
GFR calc Af Amer: >60 mL/min (ref >60)
GFR calc non Af Amer: >60 mL/min (ref >60)
GLUCOSE: 102 mg/dL — AB (ref 65–99)
POTASSIUM: 4.1 mmol/L (ref 3.5–5.1)
Sodium: 140 mmol/L (ref 135–145)
Total Bilirubin: 0.7 mg/dL (ref 0.3–1.2)
Total Protein: 6.8 g/dL (ref 6.5–8.1)

## 2015-01-24 LAB — RAPID URINE DRUG SCREEN, HOSP PERFORMED
Amphetamines: NOT DETECTED
BARBITURATES: NOT DETECTED
BENZODIAZEPINES: NOT DETECTED
Cocaine: NOT DETECTED
Opiates: NOT DETECTED
Tetrahydrocannabinol: NOT DETECTED

## 2015-01-24 MED ORDER — MINERAL OIL RE ENEM
1.0000 | ENEMA | Freq: Once | RECTAL | Status: AC
  Administered 2015-01-24: 1 via RECTAL
  Filled 2015-01-24 (×2): qty 1

## 2015-01-24 MED ORDER — KETOROLAC TROMETHAMINE 30 MG/ML IJ SOLN
30.0000 mg | Freq: Once | INTRAMUSCULAR | Status: AC
  Administered 2015-01-24: 30 mg via INTRAVENOUS
  Filled 2015-01-24: qty 1

## 2015-01-24 NOTE — ED Notes (Signed)
Per [facility] patient had a witnessed seizure this morning.  Per the patient he has no prior history of seizures.  Patient drink 3x40oz beers every day.  Patient in no apparent distress at this time.  Patient has no memory of the event.

## 2015-01-24 NOTE — Discharge Instructions (Signed)
YOU WILL BE CONTACTED FOR A FOLLOW UP APPOINTMENT WITH A NEUROLOGIST FOR FURTHER EVALUATION FOR YOUR SEIZURE.

## 2015-01-24 NOTE — ED Notes (Signed)
NAD at this time. Pt alert x4 at time of d/c. Pt verbalized that he had all belongings.

## 2015-01-24 NOTE — Consult Note (Signed)
NEURO HOSPITALIST CONSULT NOTE   Requestig physician: Dr. Clarene Duke   Reason for Consult:Seizure  HPI:                                                                                                                                          Peter Waller is an 29 y.o. male with a PMH of alcohol abuse who presents to the ED today via EMS after seizure like activity at home.  His girlfriend is present at his bedside and report that this morning she was in bed an her some noise in the living room, she went in there and saw Peter Waller convulsing his entire body.  She report that seizure like activity lasted about 10 minutes and he returned to completely normal after about 20 minutes.  He reports he has no memory of the event.  To his knowledge he has never had a seizure before although does report an episode of "falling out" that was very similar 6 months ago for which he never sought medical care. He reports he has been a heavy drinker for about 15 years.  About 5 months ago he was drinking about 24 12oz beers daily.  Since then he has been cutting down.  However he reports that if he goes too long without a drink he develops nausea, shakes.  He reports he has the shakes when he wakes up in the morning.  He has been to rehab for his drinking before.  He does not want to quit drinking. He reports that his last drink was a 40oz beer last night, with prompting form his girlfriend he also notes drinking 2 25 oz beers earlier in the afternoon yesterday.  Lately he has been consuming about 3 40oz beers a day.  Past Medical History  Diagnosis Date  . Alcohol abuse   . Alcohol abuse     Past Surgical History  Procedure Laterality Date  . Ear tube removal    . Appendectomy      No family history on file.   Social History:  reports that he has been smoking Cigarettes.  He has been smoking about 0.25 packs per day. He uses smokeless tobacco. He reports that he drinks alcohol. He reports  that he uses illicit drugs (Cocaine).  Allergies  Allergen Reactions  . Aloe Rash    MEDICATIONS:  I have reviewed the patient's current medications.   ROS:                                                                                                                                       History obtained from the patient  General ROS: negative for - chills, fatigue, fever, night sweats Psychological ROS: negative for - hallucinations, memory difficulties,  or suicidal ideation Ophthalmic ROS: negative for - blurry vision, double vision, eye pain or loss of vision ENT ROS: positive for hearing loss (chronic) negative for - epistaxis, nasal discharge, oral lesions, sore throat, tinnitus or vertigo Allergy and Immunology ROS: negative for - hives or itchy/watery eyes Hematological and Lymphatic ROS: negative for - bleeding problems, bruising Endocrine ROS: negative for - polydipsia/polyuria Respiratory ROS: negative for - cough, hemoptysis, shortness of breath or wheezing Cardiovascular ROS: negative for - chest pain, dyspnea on exertion Gastrointestinal ROS: positive for nausea when he doesn't drink negative for - abdominal pain, diarrhea, hematemesis Genito-Urinary ROS: negative for - dysuria, hematuria, incontinence or urinary frequency/urgency Musculoskeletal ROS: negative for - joint swelling or muscular weakness Neurological ROS: as noted in HPI Dermatological ROS: negative for rash and skin lesion changes   Blood pressure 111/66, pulse 75, temperature 98.2 F (36.8 C), temperature source Oral, resp. rate 16, SpO2 97 %.   Neurologic Examination:                                                                                                      HEENT-  Normocephalic, no lesions, without obvious abnormality.  Normal external eye and conjunctiva.  .   Normal  external nose, mucus membranes and septum.  Normal pharynx. Cardiovascular- regular rate and rhythm, S1, S2 normal, no murmur, click, rub or gallop, pulses palpable throughout   Lungs- chest clear, no wheezing, rales, normal symmetric air entry, Heart exam - S1, S2 normal, no murmur, no gallop, rate regular Abdomen- soft, non-tender; bowel sounds normal; no masses,  no organomegaly Extremities- no edema Lymph-no adenopathy palpable Musculoskeletal-no joint tenderness, deformity or swelling, mild paraspinal tenderness in mid thoracic spine on the left Skin-warm and dry, no hyperpigmentation, vitiligo, or suspicious lesions  Neurological Examination Mental Status: Alert, orientedx4, thought content appropriate.  Speech fluent without evidence of aphasia.   Cranial Nerves: II: Visual fields grossly normal, pupils equal, round, reactive to light and accommodation III,IV, VI: ptosis not present, extra-ocular motions intact bilaterally V,VII: smile symmetric, facial light touch sensation normal bilaterally VIII: hearing  normal bilaterally IX,X: uvula rises symmetrically XI: bilateral shoulder shrug XII: midline tongue extension Motor: Right : Upper extremity   5/5    Left:     Upper extremity   5/5  Lower extremity   5/5     Lower extremity   5/5 Tone and bulk:normal tone throughout; no atrophy noted, no tremor Sensory: light touch intact throughout, bilaterally Deep Tendon Reflexes: 2+ and symmetric throughout Plantars: Right: downgoing   Left: downgoing Cerebellar: normal finger-to-nose Gait: not assessed      Lab Results: Basic Metabolic Panel:  Recent Labs Lab 01/24/15 0833  NA 140  K 4.1  CL 105  CO2 28  GLUCOSE 102*  BUN 10  CREATININE 1.02  CALCIUM 9.8    Liver Function Tests:  Recent Labs Lab 01/24/15 0833  AST 25  ALT 17  ALKPHOS 42  BILITOT 0.7  PROT 6.8  ALBUMIN 4.0   No results for input(s): LIPASE, AMYLASE in the last 168 hours. No results for  input(s): AMMONIA in the last 168 hours.  CBC:  Recent Labs Lab 01/24/15 0833  WBC 9.3  NEUTROABS 6.8  HGB 15.9  HCT 47.8  MCV 105.5*  PLT 222    Cardiac Enzymes: No results for input(s): CKTOTAL, CKMB, CKMBINDEX, TROPONINI in the last 168 hours.  Lipid Panel: No results for input(s): CHOL, TRIG, HDL, CHOLHDL, VLDL, LDLCALC in the last 168 hours.  CBG: No results for input(s): GLUCAP in the last 168 hours.  Microbiology: Results for orders placed or performed during the hospital encounter of 09/09/07  Rapid strep screen     Status: None   Collection Time: 09/09/07  8:36 AM  Result Value Ref Range Status   Streptococcus, Group A Screen (Direct) NEGATIVE  Final    Coagulation Studies: No results for input(s): LABPROT, INR in the last 72 hours.  Imaging: Ct Head Wo Contrast  01/24/2015  CLINICAL DATA:  Seizure EXAM: CT HEAD WITHOUT CONTRAST TECHNIQUE: Contiguous axial images were obtained from the base of the skull through the vertex without intravenous contrast. COMPARISON:  October 07, 2004 FINDINGS: The ventricles are normal in size and configuration. There is no intracranial mass, hemorrhage, extra-axial fluid collection, midline shift. Gray-white compartments. No acute infarct evident. The bony calvarium appears intact. Mastoids on the right are clear. There is evidence of previous mastoidectomy on the left. There is debris in the left external auditory canal. No intraorbital lesions are identified. IMPRESSION: Evidence of previous left-sided mastoidectomy. Probable cerumen in the left external auditory canal. No intracranial mass, hemorrhage, or focal gray -white compartment lesion. Electronically Signed   By: Bretta Bang III M.D.   On: 01/24/2015 08:50     Assessment/Plan: 1st Unprovoked seizure - He does not seem to have significantly cut back on his ETOH to have caused a seizure.  I am concerned this could be his first unprovoked seizure. - No driving for 6  months. - AE per attending - Check UDS for cocaine (may be provoked by cocaine) - Needs MRI and EEG, can obtain by admission for observation or as outpatient.  Alcohol Abuse - He does not seem to have cut his current ETOH consumption by much.  His ETOH level is undetectable which would be c/w ETOH withdraw although he is not currenty tachycardic, tremulous, or having any visual hallucinations. - Discussed cessation, patient does not want referral to rehab  Macrocytosis without anemia - Likely secondary for dietary deficiency due to ETOH abuse.  Supplement with folate, thiamine,  MVI.  Gust Rung, DO [facility] PGY-3 Pager: [phone]   01/24/2015, 11:32 AM

## 2015-01-24 NOTE — ED Provider Notes (Signed)
CSN: [id number]     Arrival date & time 01/24/15  6295 History   First MD Initiated Contact with Patient 01/24/15 [phone]     Chief Complaint  Patient presents with  . Seizures     (Consider location/radiation/quality/duration/timing/severity/associated sxs/prior Treatment) HPI Comments: Patient is a 29 year old male with a PMHx of alcohol abuse brought into [facility] ED by EMS for a recent onset seizure. Patient states he woke up around 6:30 am this morning to help his kids get ready for school. States he was laid down on his bed to get some rest and does not remember having any seizure activity. States he was told by EMS that he had a seizure and fell in the kitchen. He is not sure who called EMS. He lives with his girlfriend and his 3 children. He is not sure if he bit his tongue or lost control of bowel/ bladder function. Patient reports having a similar episode 6 months ago when he was told he fell and his "body was shaking", however, states he does not recall the event and did not seek medical attention at that time. Reports waking up at night "shaking very bad" at least once a week. Patient reports drinking 3-4 tall cans of beer "40s" everyday (CAGE score 3/4). States he did not have money yesterday, and as such, only had 2 cans of beer, last drink was yesterday afternoon. States he gets "migraine headaches" which go away after taking Tylenol, last headache was several months ago. States he has never seen a neurologist. 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 States he last saw his ENT (Dr. Monday) 5 years ago. Denies having any recent trauma or fall. Denies having any recent illness. Denies having any fevers, chills, CP, SOB, nausea, vomiting, or diarrhea. Denies taking any medications at home. Denies any recent drug use, states he last smoked Marijuana 6 months ago.   Patient is a 29 y.o. male presenting with seizures.   Seizures   Past Medical History  Diagnosis Date  . Alcohol abuse   . Alcohol abuse    Past Surgical History  Procedure Laterality Date  . Ear tube removal    . Appendectomy     No family history on file. Social History  Substance Use Topics  . Smoking status: Current Every Day Smoker -- 0.25 packs/day    Types: Cigarettes  . Smokeless tobacco: Current User  . Alcohol Use: Yes     Comment: 120 oz everyday    Review of Systems  Constitutional: Negative for fever and chills.  HENT: Negative for sneezing and sore throat.   Eyes: Negative for pain and visual disturbance.  Respiratory: Negative for cough, shortness of breath and wheezing.   Cardiovascular: Negative for chest pain and leg swelling.  Gastrointestinal: Negative for nausea, vomiting, abdominal pain, diarrhea and constipation.  Genitourinary: Negative for dysuria.  Musculoskeletal: Negative for back pain and neck pain.  Neurological: Positive for seizures. Negative for speech difficulty, weakness and numbness.      Allergies  Aloe  Home Medications   Prior to Admission medications   Medication Sig Start Date End Date Taking? Authorizing Provider  cyclobenzaprine (FLEXERIL) 10 MG tablet Take 1 tablet (10 mg total) by mouth once. Patient not taking: Reported on 04/05/2014 03/24/13   Francee Piccolo, PA-C  LORazepam (ATIVAN) 1 MG tablet Take 1 tablet (1 mg total) by mouth every 6 (  six) hours as needed (Nausea/Anxiety). 04/05/14   Nicole Pisciotta, PA-C   BP 112/77 mmHg  Pulse 80  Temp(Src) 98.2 F (36.8 C) (Oral)  Resp 18  SpO2 97% Physical Exam  Constitutional: He is oriented to person, place, and time. He appears well-developed and well-nourished. No distress.  HENT:  Head: Normocephalic and atraumatic.  Significant wax buildup in both ears making it difficult to visualize the tympanic membrane bilaterally.   Eyes: EOM are normal. Pupils are equal, round, and reactive to light.  Neck: Neck supple.  No tracheal deviation present.  Cardiovascular: Normal rate, regular rhythm and intact distal pulses.  Exam reveals no gallop and no friction rub.   No murmur heard. Pulmonary/Chest: Effort normal. No respiratory distress. He has no wheezes. He has no rales.  Abdominal: Soft. Bowel sounds are normal. He exhibits no distension. There is no tenderness.  Musculoskeletal: Normal range of motion. He exhibits no edema or tenderness.  Neurological: He is alert and oriented to person, place, and time.  Skin: Skin is warm and dry.    ED Course  Procedures (including critical care time) Labs Review Labs Reviewed  COMPREHENSIVE METABOLIC PANEL  ETHANOL  CBC WITH DIFFERENTIAL/PLATELET  URINE RAPID DRUG SCREEN, HOSP PERFORMED    Imaging Review No results found. I have personally reviewed and evaluated these images and lab results as part of my medical decision-making.   EKG Interpretation   Date/Time:  Monday January 24 2015 07:42:07 EST Ventricular Rate:  80 PR Interval:  157 QRS Duration: 87 QT Interval:  352 QTC Calculation: 406 R Axis:   100 Text Interpretation:  Sinus rhythm Probable left atrial enlargement Right  axis deviation ST elev, probable normal early repol pattern No previous  ECGs available Confirmed by LITTLE MD, RACHEL ([id number]) on 01/24/2015 8:22:14  AM      MDM   Final diagnoses:  None   Seizure Patient has a history of alcohol abuse - drinks 3-4 tall cans of beer everyday, last drink was yesterday afternoon. Reports having history of similar seizure activity 6 months ago and states he "shakes" at night at least once a week. In the ED today, VSS. Ethanol level <5 which is consistent with alcohol withdrawal. However, patient is not tachycardic, tremulous, or having any visual disturbances. Hgb is normal but patient has macrocytosis (MCV 105.5) consistent with possible folate deficiency 2/2 regular alcohol use. CBG 102. LFTs normal. STAT CT of head w/o contrast did  not show any intracranial mass or hemorrhage. Neuro saw the patient and are recommending outpatient follow-up.       John GiovanniVasundhra Deazia Lampi, MD 01/24/15 1336  Laurence Spatesachel Morgan Little, MD 01/25/15 1344

## 2015-07-11 ENCOUNTER — Encounter (HOSPITAL_COMMUNITY): Payer: Self-pay

## 2015-07-11 ENCOUNTER — Emergency Department (HOSPITAL_COMMUNITY)
Admission: EM | Admit: 2015-07-11 | Discharge: 2015-07-11 | Disposition: A | Discharge status: Home / Self Care | Payer: Medicaid Other | Attending: Emergency Medicine | Admitting: Emergency Medicine

## 2015-07-11 DIAGNOSIS — F17210 Nicotine dependence, cigarettes, uncomplicated: Secondary | ICD-10-CM | POA: Insufficient documentation

## 2015-07-11 DIAGNOSIS — Z5321 Procedure and treatment not carried out due to patient leaving prior to being seen by health care provider: Secondary | ICD-10-CM | POA: Insufficient documentation

## 2015-07-11 NOTE — ED Notes (Signed)
Pt got an attitude when told he was going to have blood drawn and said he thought he was going to get numbers to call and RN daid he was going to get some resources. Pt and girlfriend walked out.

## 2015-07-11 NOTE — ED Notes (Signed)
Pt here for medical clearance for alcohol detox, last drink 30 minutes ago, pt drinks beer

## 2015-12-17 ENCOUNTER — Encounter (HOSPITAL_COMMUNITY): Payer: Self-pay | Admitting: Emergency Medicine

## 2015-12-17 ENCOUNTER — Emergency Department (HOSPITAL_COMMUNITY)
Admission: EM | Admit: 2015-12-17 | Discharge: 2015-12-17 | Disposition: A | Discharge status: Home / Self Care | Payer: Medicaid Other | Attending: Emergency Medicine | Admitting: Emergency Medicine

## 2015-12-17 DIAGNOSIS — F17210 Nicotine dependence, cigarettes, uncomplicated: Secondary | ICD-10-CM | POA: Diagnosis not present

## 2015-12-17 DIAGNOSIS — Y999 Unspecified external cause status: Secondary | ICD-10-CM | POA: Diagnosis not present

## 2015-12-17 DIAGNOSIS — Z23 Encounter for immunization: Secondary | ICD-10-CM | POA: Diagnosis not present

## 2015-12-17 DIAGNOSIS — Y929 Unspecified place or not applicable: Secondary | ICD-10-CM | POA: Diagnosis not present

## 2015-12-17 DIAGNOSIS — Y939 Activity, unspecified: Secondary | ICD-10-CM | POA: Insufficient documentation

## 2015-12-17 DIAGNOSIS — W260XXA Contact with knife, initial encounter: Secondary | ICD-10-CM | POA: Insufficient documentation

## 2015-12-17 DIAGNOSIS — S61511A Laceration without foreign body of right wrist, initial encounter: Secondary | ICD-10-CM | POA: Insufficient documentation

## 2015-12-17 MED ORDER — LIDOCAINE-EPINEPHRINE (PF) 2 %-1:200000 IJ SOLN
20.0000 mL | Freq: Once | INTRAMUSCULAR | Status: AC
  Administered 2015-12-17: 20 mL
  Filled 2015-12-17: qty 20

## 2015-12-17 MED ORDER — TETANUS-DIPHTH-ACELL PERTUSSIS 5-2.5-18.5 LF-MCG/0.5 IM SUSP
0.5000 mL | Freq: Once | INTRAMUSCULAR | Status: AC
  Administered 2015-12-17: 0.5 mL via INTRAMUSCULAR
  Filled 2015-12-17: qty 0.5
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
## 2015-12-17 NOTE — ED Triage Notes (Signed)
Pt was cooperative with PA during procedure

## 2015-12-17 NOTE — ED Triage Notes (Signed)
Pt is anxious, pacing in room . Sister and friend at bedside. Sister continues to ask pt to relax and wait for stitches. Pt keeps repeating that he just wants a cigarette. Pt has slurred speech, able to ambulate. Friend stated that he had a lot of beer today.Bleeding on wrist controlled
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
## 2016-10-02 IMAGING — CT CT HEAD W/O CM
2 series · 16 of 30 positions shown, 18 images · non-contrast
Comparison: October 07, 2004

CLINICAL DATA: Seizure

EXAM:
CT HEAD WITHOUT CONTRAST
TECHNIQUE: Contiguous axial images were obtained from the base of the skull
through the vertex without intravenous contrast.

[Series 201: head w/o, idose (1) · axial · non-contrast · 0.49mm/px · z∈[+150,+270]mm · 8 of 32 slices shown, 10 images]
[im 4/32  brain]
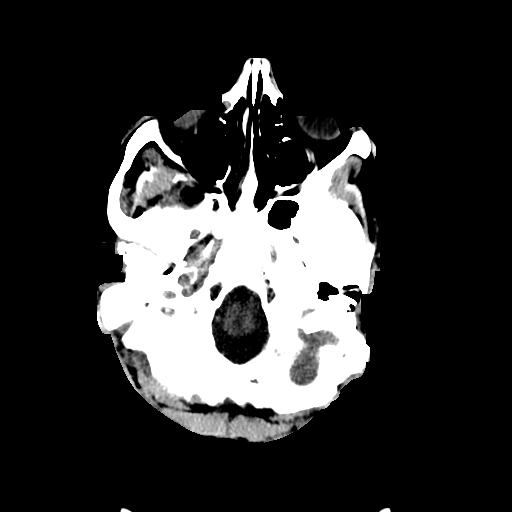
[im 4/32  bone]
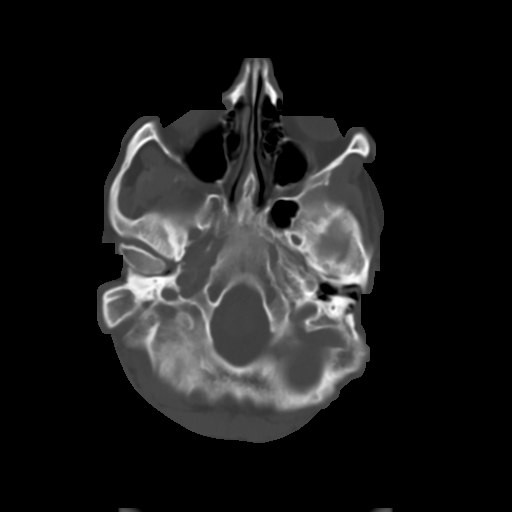
[im 7/32  brain]
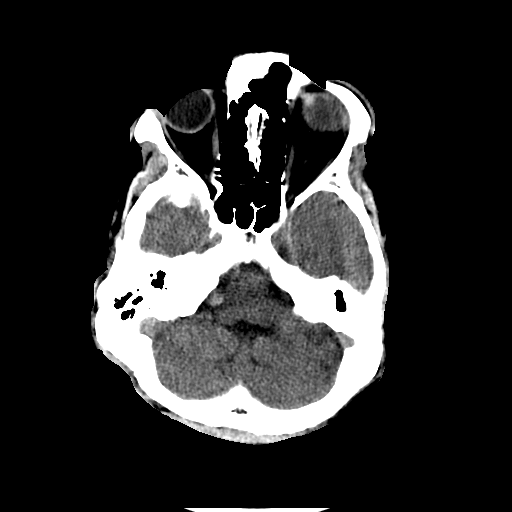
[im 11/32  brain]
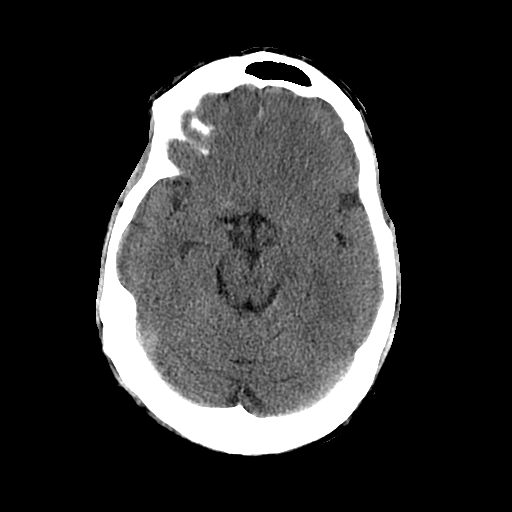
[im 14/32  brain]
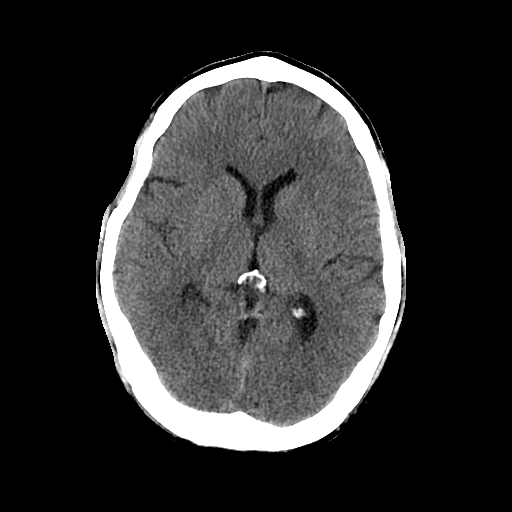
[im 18/32  brain]
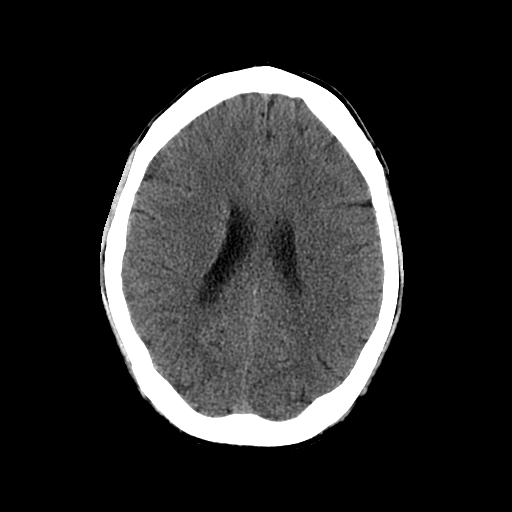
[im 18/32  bone]
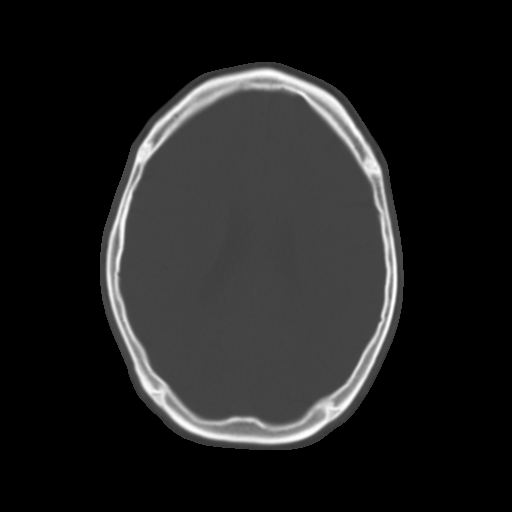
[im 21/32  brain]
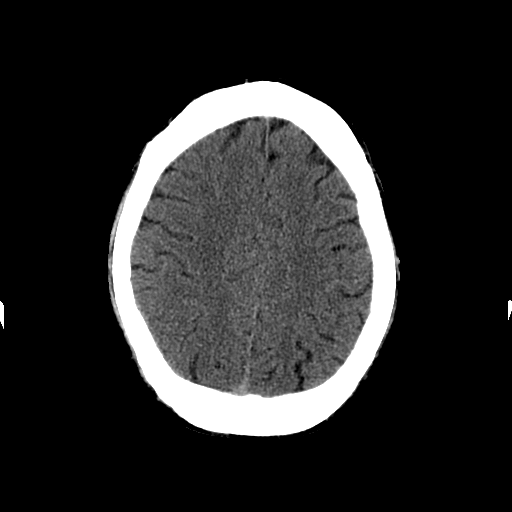
[im 25/32  brain]
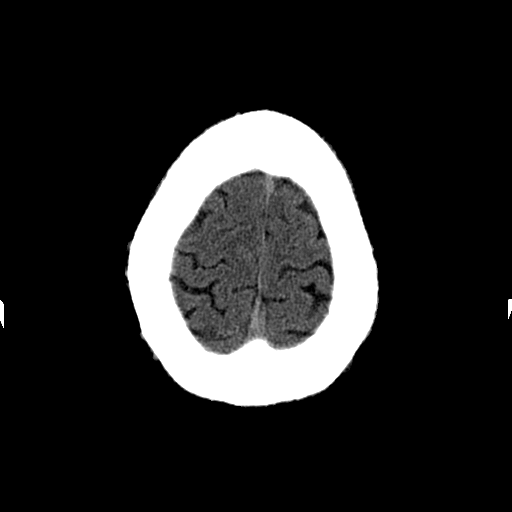
[im 28/32  brain]
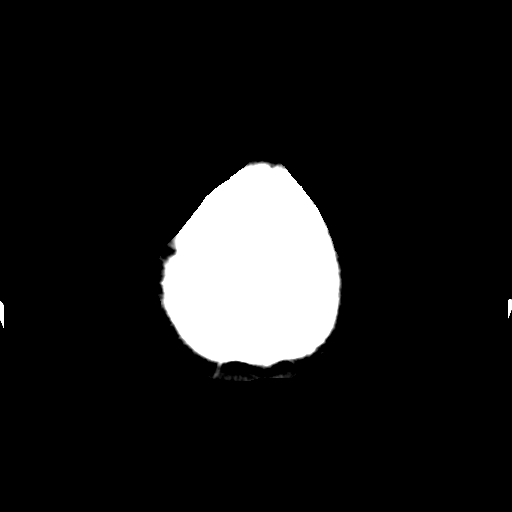

[Series 202: head w/o bone, idose (1) · axial · non-contrast · 0.49mm/px · z∈[+149,+274]mm · 8 of 64 slices shown]
[im 7/64  bone]
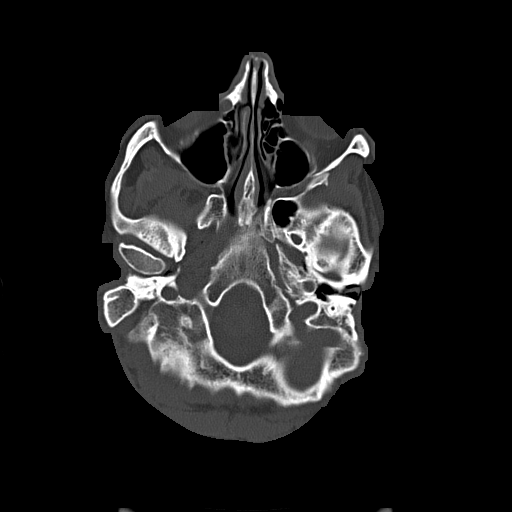
[im 14/64  bone]
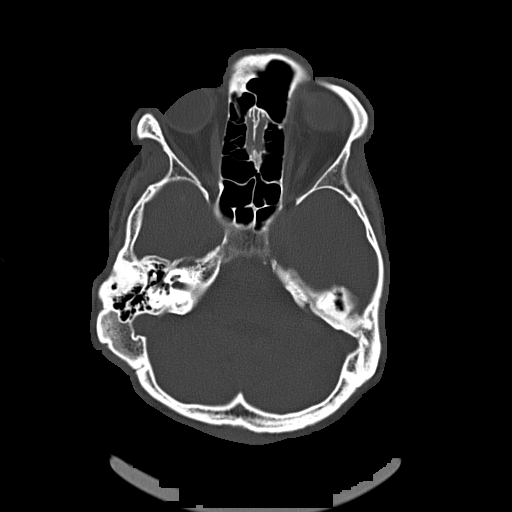
[im 20/64  bone]
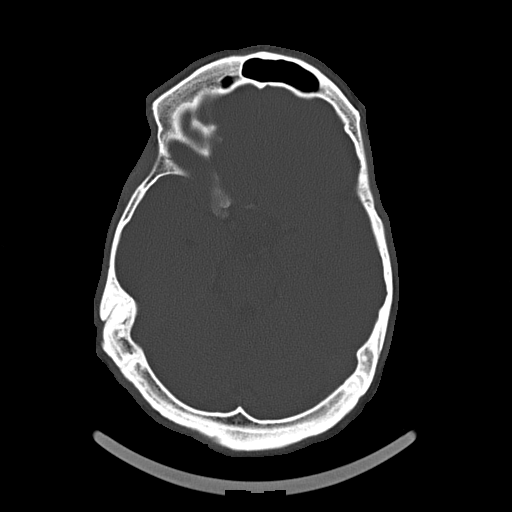
[im 27/64  bone]
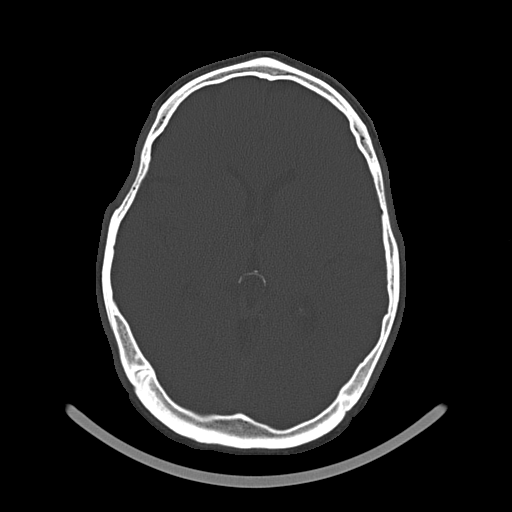
[im 37/64  bone]
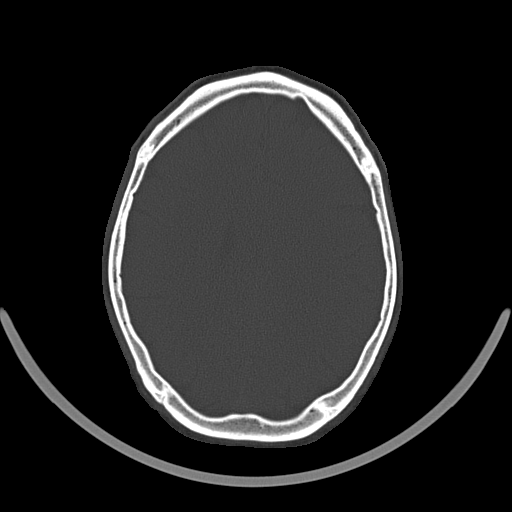
[im 44/64  bone]
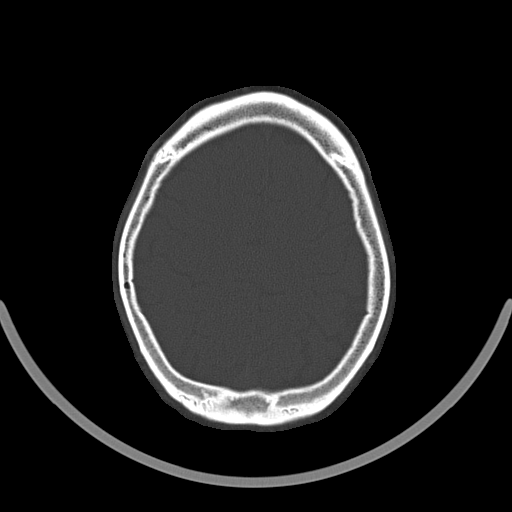
[im 50/64  bone]
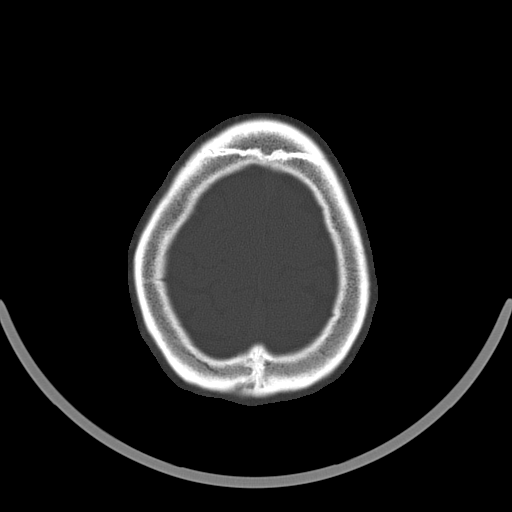
[im 57/64  bone]
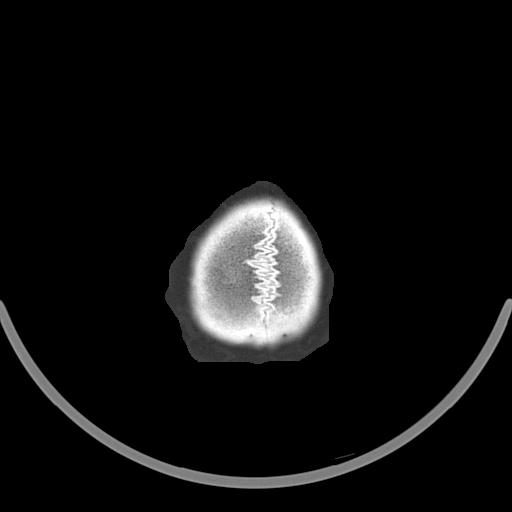

[16 of 30 positions shown; findings below may reference images not displayed]

FINDINGS: The ventricles are normal in size and configuration. There is no
intracranial mass, hemorrhage, extra-axial fluid collection, midline
shift. Gray-white compartments. No acute infarct evident. The bony
calvarium appears intact. Mastoids on the right are clear. There is
evidence of previous mastoidectomy on the left. There is debris in
the left external auditory canal. No intraorbital lesions are
identified.
IMPRESSION: Evidence of previous left-sided mastoidectomy. Probable cerumen in
the left external auditory canal. No intracranial mass, hemorrhage,
or focal gray -white compartment lesion.

## 2016-11-29 ENCOUNTER — Encounter: Payer: Self-pay | Admitting: Primary Care

## 2017-01-24 ENCOUNTER — Emergency Department (HOSPITAL_COMMUNITY)
Admission: EM | Admit: 2017-01-24 | Discharge: 2017-01-24 | Disposition: A | Discharge status: Home / Self Care | Payer: Self-pay | Attending: Emergency Medicine | Admitting: Emergency Medicine

## 2017-01-24 ENCOUNTER — Other Ambulatory Visit: Payer: Self-pay

## 2017-01-24 ENCOUNTER — Encounter (HOSPITAL_COMMUNITY): Payer: Self-pay | Admitting: *Deleted

## 2017-01-24 DIAGNOSIS — F17210 Nicotine dependence, cigarettes, uncomplicated: Secondary | ICD-10-CM | POA: Insufficient documentation

## 2017-01-24 DIAGNOSIS — Z79899 Other long term (current) drug therapy: Secondary | ICD-10-CM | POA: Insufficient documentation

## 2017-01-24 DIAGNOSIS — F1010 Alcohol abuse, uncomplicated: Secondary | ICD-10-CM | POA: Insufficient documentation

## 2017-01-24 HISTORY — DX: Unspecified convulsions: R56.9

## 2017-01-24 LAB — COMPREHENSIVE METABOLIC PANEL
ALK PHOS: 53 U/L (ref 38–126)
ALT: 26 U/L (ref 17–63)
AST: 51 U/L — AB (ref 15–41)
Albumin: 4.2 g/dL (ref 3.5–5.0)
Anion gap: 12 (ref 5–15)
BUN: 6 mg/dL (ref 6–20)
CALCIUM: 9.2 mg/dL (ref 8.9–10.3)
CO2: 25 mmol/L (ref 22–32)
CREATININE: 0.96 mg/dL (ref 0.61–1.24)
Chloride: 99 mmol/L — ABNORMAL LOW (ref 101–111)
GFR calc Af Amer: >60 mL/min (ref >60)
GFR calc non Af Amer: >60 mL/min (ref >60)
Glucose, Bld: 121 mg/dL — ABNORMAL HIGH (ref 65–99)
Potassium: 4.1 mmol/L (ref 3.5–5.1)
Sodium: 136 mmol/L (ref 135–145)
Total Bilirubin: 2.2 mg/dL — ABNORMAL HIGH (ref 0.3–1.2)
Total Protein: 7.3 g/dL (ref 6.5–8.1)

## 2017-01-24 LAB — CBC
HEMATOCRIT: 44.4 % (ref 39.0–52.0)
Hemoglobin: 15.3 g/dL (ref 13.0–17.0)
MCH: 34.5 pg — AB (ref 26.0–34.0)
MCHC: 34.5 g/dL (ref 30.0–36.0)
MCV: 100 fL (ref 78.0–100.0)
Platelets: 238 10*3/uL (ref 150–400)
RBC: 4.44 MIL/uL (ref 4.22–5.81)
RDW: 13 % (ref 11.5–15.5)
WBC: 7.4 10*3/uL (ref 4.0–10.5)

## 2017-01-24 LAB — ETHANOL: Alcohol, Ethyl (B): <10 mg/dL (ref <10)

## 2017-01-24 MED ORDER — ONDANSETRON HCL 4 MG PO TABS: 4.0000 mg | ORAL_TABLET | Freq: Four times a day (QID) | ORAL | 0 refills | Status: DC

## 2017-01-24 MED ORDER — CHLORDIAZEPOXIDE HCL 25 MG PO CAPS: ORAL_CAPSULE | ORAL | 0 refills | Status: DC

## 2017-01-24 MED ORDER — ONDANSETRON 4 MG PO TBDP
4.0000 mg | ORAL_TABLET | Freq: Once | ORAL | Status: AC
  Administered 2017-01-24: 4 mg via ORAL
  Filled 2017-01-24: qty 1

## 2017-01-24 NOTE — Discharge Instructions (Addendum)
Medications: Librium, Zofran  Treatment: Take Librium taper as prescribed.  Take Zofran every 6 hours as needed for nausea or vomiting  Follow-up: Please follow-up at 1 of the outpatient residential substance abuse resources below.  Please return the emergency department if you develop any new or worsening symptoms including hallucinations, suicidal or homicidal thoughts, seizures, or any other new or concerning symptoms.

## 2017-01-24 NOTE — ED Notes (Signed)
Pt did not answer when called to be roomed at 11:10.

## 2017-01-24 NOTE — ED Notes (Signed)
Pt informed he needs to give a urine sample prior to discharge at 12:39.

## 2017-01-24 NOTE — ED Triage Notes (Signed)
To ED for detox from ETOH. Pt states he drinks 1 case a day and wants to stop. No ETOH today. Denies SI/HI. Very cooperative.

## 2017-01-25 NOTE — ED Provider Notes (Signed)
[facility]   CSN: [id number] Arrival date & time: 01/24/17  1028     History   Chief Complaint Chief Complaint  Patient presents with  . Medical Clearance    HPI Peter Waller is a 31 y.o. male with history of alcohol abuse who presents for desire to detox.  Patient reports he has been drinking heavily since he was [age] or 31 years old.  Currently, he drinks at least a case of beer per day.  He has decided he wants to quit due to financial reasons and feeling sick on his stomach every morning.  He has been in inpatient detox at behavioral health in the past, about 10 years ago.  He denies any hallucinations or seizures recently.  He has had one seizure over a year ago when he tried to quit himself.  He denies any SI, HI.  He denies any other complaints today. Patient last drank yesterday.  HPI  Past Medical History:  Diagnosis Date  . Alcohol abuse   . Alcohol abuse   . Seizures [facility](HCC)    DTs    Patient Active Problem List   Diagnosis Date Noted  . First time seizure [facility](HCC)     Past Surgical History:  Procedure Laterality Date  . APPENDECTOMY    . EAR TUBE REMOVAL         Home Medications    Prior to Admission medications   Medication Sig Start Date End Date Taking? Authorizing Provider  chlordiazePOXIDE (LIBRIUM) 25 MG capsule 50mg  PO TID x 1D, then 25-50mg  PO BID X 1D, then 25-50mg  PO QD X 1D 01/24/17   Rut Betterton M, PA-C  cyclobenzaprine (FLEXERIL) 10 MG tablet Take 1 tablet (10 mg total) by mouth once. Patient not taking: Reported on 04/05/2014 03/24/13   Piepenbrink, Victorino DikeJennifer, PA-C  LORazepam (ATIVAN) 1 MG tablet Take 1 tablet (1 mg total) by mouth every 6 (six) hours as needed (Nausea/Anxiety). Patient not taking: Reported on 01/24/2015 04/05/14   Pisciotta, Joni ReiningNicole, PA-C  ondansetron (ZOFRAN) 4 MG tablet Take 1 tablet (4 mg total) by mouth every 6 (six) hours. 01/24/17   Emi HolesLaw, Zoiee Wimmer M, PA-C    Family  History No family history on file.  Social History Social History   Tobacco Use  . Smoking status: Current Every Day Smoker    Packs/day: 0.25    Types: Cigarettes  . Smokeless tobacco: Current User    Types: Chew  Substance Use Topics  . Alcohol use: Yes    Alcohol/week: 14.4 oz    Types: 24 Cans of beer per week  . Drug use: No    Comment: Admits using 2 days ago--"I am not addicted'     Allergies   Aloe   Review of Systems Review of Systems  Constitutional: Negative for chills and fever.  HENT: Negative for facial swelling and sore throat.   Respiratory: Negative for shortness of breath.   Cardiovascular: Negative for chest pain.  Gastrointestinal: Negative for abdominal pain, nausea and vomiting.  Genitourinary: Negative for dysuria.  Musculoskeletal: Negative for back pain.  Skin: Negative for rash and wound.  Neurological: Negative for seizures and headaches.  Psychiatric/Behavioral: Negative for hallucinations and suicidal ideas. The patient is not nervous/anxious.      Physical Exam Updated Vital Signs BP 129/88   Pulse 73   Temp 98.6 F (37 C) (Oral)   Resp 20   SpO2 97%   Physical Exam  Constitutional: He  appears well-developed and well-nourished. No distress.  HENT:  Head: Normocephalic and atraumatic.  Mouth/Throat: Oropharynx is clear and moist. No oropharyngeal exudate.  Eyes: Conjunctivae are normal. Pupils are equal, round, and reactive to light. Right eye exhibits no discharge. Left eye exhibits no discharge. No scleral icterus.  Neck: Normal range of motion. Neck supple. No thyromegaly present.  Cardiovascular: Normal rate, regular rhythm, normal heart sounds and intact distal pulses. Exam reveals no gallop and no friction rub.  No murmur heard. Pulmonary/Chest: Effort normal and breath sounds normal. No stridor. No respiratory distress. He has no wheezes. He has no rales.  Abdominal: Soft. Bowel sounds are normal. He exhibits no  distension. There is no tenderness. There is no rebound and no guarding.  Musculoskeletal: He exhibits no edema.  Lymphadenopathy:    He has no cervical adenopathy.  Neurological: He is alert. Coordination normal.  Skin: Skin is warm and dry. No rash noted. He is not diaphoretic. No pallor.  Psychiatric: He has a normal mood and affect. He is not actively hallucinating. He expresses no homicidal and no suicidal ideation. He expresses no suicidal plans and no homicidal plans.  Nursing note and vitals reviewed.    ED Treatments / Results  Labs (all labs ordered are listed, but only abnormal results are displayed) Labs Reviewed  COMPREHENSIVE METABOLIC PANEL - Abnormal; Notable for the following components:      Result Value   Chloride 99 (*)    Glucose, Bld 121 (*)    AST 51 (*)    Total Bilirubin 2.2 (*)    All other components within normal limits  CBC - Abnormal; Notable for the following components:   MCH 34.5 (*)    All other components within normal limits  ETHANOL    EKG  EKG Interpretation None       Radiology No results found.  Procedures Procedures (including critical care time)  Medications Ordered in ED Medications  ondansetron (ZOFRAN-ODT) disintegrating tablet 4 mg (4 mg Oral Given 01/24/17 1323)     Initial Impression / Assessment and Plan / ED Course  I have reviewed the triage vital signs and the nursing notes.  Pertinent labs & imaging results that were available during my care of the patient were reviewed by me and considered in my medical decision making (see chart for details).     Patient presenting for detox from alcohol.  Labs are stable, except for mild elevation in AST, 51, and total bilirubin 2.2.  Patient seems to be is well-appearing.  He denies any SI, HI, hallucinations, seizures, or other concerning symptoms of DTs.  Patient seems to be motivated to detox.  Considering no indication for admission at this time, will give patient  several outpatient resources and Librium taper.  Strict return precautions given.  Patient understands and agrees with plan. Patient vitals stable throughout ED course and discharged in satisfactory condition. I discussed patient case with Dr. Clarene DukeLittle who guided the patient's management and agrees with plan.   Final Clinical Impressions(s) / ED Diagnoses   Final diagnoses:  Alcohol abuse    ED Discharge Orders        Ordered    chlordiazePOXIDE (LIBRIUM) 25 MG capsule     01/24/17 1237    ondansetron (ZOFRAN) 4 MG tablet  Every 6 hours     01/24/17 1237       Emi HolesLaw, Mikaiya Tramble M, PA-C 01/25/17 1031    Little, Ambrose Finlandachel Morgan, MD 01/25/17 1037

## 2020-03-04 ENCOUNTER — Encounter: Payer: Self-pay | Admitting: Primary Care

## 2022-03-27 ENCOUNTER — Other Ambulatory Visit (HOSPITAL_COMMUNITY)
Admission: EM | Admit: 2022-03-27 | Discharge: 2022-03-31 | Disposition: A | Discharge status: Home / Self Care | Payer: Medicaid Other | Attending: Psychiatry | Admitting: Psychiatry

## 2022-03-27 DIAGNOSIS — Z1152 Encounter for screening for COVID-19: Secondary | ICD-10-CM | POA: Diagnosis not present

## 2022-03-27 DIAGNOSIS — F329 Major depressive disorder, single episode, unspecified: Secondary | ICD-10-CM | POA: Insufficient documentation

## 2022-03-27 DIAGNOSIS — R569 Unspecified convulsions: Secondary | ICD-10-CM

## 2022-03-27 DIAGNOSIS — F1010 Alcohol abuse, uncomplicated: Secondary | ICD-10-CM | POA: Diagnosis not present

## 2022-03-27 DIAGNOSIS — F419 Anxiety disorder, unspecified: Secondary | ICD-10-CM | POA: Insufficient documentation

## 2022-03-27 DIAGNOSIS — F1090 Alcohol use, unspecified, uncomplicated: Secondary | ICD-10-CM

## 2022-03-27 LAB — COMPREHENSIVE METABOLIC PANEL
ALT: 26 U/L (ref 0–44)
AST: 46 U/L — ABNORMAL HIGH (ref 15–41)
Albumin: 4.1 g/dL (ref 3.5–5.0)
Alkaline Phosphatase: 44 U/L (ref 38–126)
Anion gap: 13 (ref 5–15)
BUN: <5 mg/dL — ABNORMAL LOW (ref 6–20)
CO2: 26 mmol/L (ref 22–32)
Calcium: 9.1 mg/dL (ref 8.9–10.3)
Chloride: 100 mmol/L (ref 98–111)
Creatinine, Ser: 0.85 mg/dL (ref 0.61–1.24)
GFR, Estimated: >60 mL/min (ref >60)
Glucose, Bld: 79 mg/dL (ref 70–99)
Potassium: 4.1 mmol/L (ref 3.5–5.1)
Sodium: 139 mmol/L (ref 135–145)
Total Bilirubin: 0.5 mg/dL (ref 0.3–1.2)
Total Protein: 7.3 g/dL (ref 6.5–8.1)

## 2022-03-27 LAB — CBC WITH DIFFERENTIAL/PLATELET
Abs Immature Granulocytes: 0.01 10*3/uL (ref 0.00–0.07)
Basophils Absolute: 0.1 10*3/uL (ref 0.0–0.1)
Basophils Relative: 1 %
Eosinophils Absolute: 0.1 10*3/uL (ref 0.0–0.5)
Eosinophils Relative: 1 %
HCT: 43.3 % (ref 39.0–52.0)
Hemoglobin: 15.4 g/dL (ref 13.0–17.0)
Immature Granulocytes: 0 %
Lymphocytes Relative: 41 %
Lymphs Abs: 2.1 10*3/uL (ref 0.7–4.0)
MCH: 35.2 pg — ABNORMAL HIGH (ref 26.0–34.0)
MCHC: 35.6 g/dL (ref 30.0–36.0)
MCV: 98.9 fL (ref 80.0–100.0)
Monocytes Absolute: 0.4 10*3/uL (ref 0.1–1.0)
Monocytes Relative: 8 %
Neutro Abs: 2.6 10*3/uL (ref 1.7–7.7)
Neutrophils Relative %: 49 %
Platelets: 234 10*3/uL (ref 150–400)
RBC: 4.38 MIL/uL (ref 4.22–5.81)
RDW: 14.1 % (ref 11.5–15.5)
WBC: 5.2 10*3/uL (ref 4.0–10.5)
nRBC: 0 % (ref 0.0–0.2)

## 2022-03-27 LAB — VALPROIC ACID LEVEL: Valproic Acid Lvl: 59 ug/mL (ref 50.0–100.0)

## 2022-03-27 LAB — RESP PANEL BY RT-PCR (RSV, FLU A&B, COVID)  RVPGX2
Influenza A by PCR: NEGATIVE
Influenza B by PCR: NEGATIVE
Resp Syncytial Virus by PCR: NEGATIVE
SARS Coronavirus 2 by RT PCR: NEGATIVE

## 2022-03-27 LAB — URINALYSIS, COMPLETE (UACMP) WITH MICROSCOPIC
Bacteria, UA: NONE SEEN
Bilirubin Urine: NEGATIVE
Glucose, UA: NEGATIVE mg/dL
Hgb urine dipstick: NEGATIVE
Ketones, ur: NEGATIVE mg/dL
Leukocytes,Ua: NEGATIVE
Nitrite: NEGATIVE
Protein, ur: NEGATIVE mg/dL
Specific Gravity, Urine: 1.004 — ABNORMAL LOW (ref 1.005–1.030)
pH: 8 (ref 5.0–8.0)

## 2022-03-27 LAB — HIV ANTIBODY (ROUTINE TESTING W REFLEX): HIV Screen 4th Generation wRfx: NONREACTIVE

## 2022-03-27 LAB — LIPID PANEL
Cholesterol: 184 mg/dL (ref 0–200)
HDL: 132 mg/dL (ref >40)
Total CHOL/HDL Ratio: 1.4 RATIO
Triglycerides: 71 mg/dL (ref <150)
VLDL: 14 mg/dL (ref 0–40)

## 2022-03-27 LAB — POCT URINE DRUG SCREEN - MANUAL ENTRY (I-SCREEN)
POC Amphetamine UR: NOT DETECTED
POC Buprenorphine (BUP): NOT DETECTED
POC Cocaine UR: NOT DETECTED
POC Marijuana UR: NOT DETECTED
POC Methadone UR: NOT DETECTED
POC Methamphetamine UR: NOT DETECTED
POC Morphine: NOT DETECTED
POC Oxazepam (BZO): NOT DETECTED
POC Oxycodone UR: NOT DETECTED
POC Secobarbital (BAR): NOT DETECTED

## 2022-03-27 LAB — HEMOGLOBIN A1C
Hgb A1c MFr Bld: 5.5 % (ref 4.8–5.6)
Mean Plasma Glucose: 111.15 mg/dL

## 2022-03-27 LAB — MAGNESIUM: Magnesium: 2 mg/dL (ref 1.7–2.4)

## 2022-03-27 LAB — ETHANOL: Alcohol, Ethyl (B): 209 mg/dL — ABNORMAL HIGH (ref <10)

## 2022-03-27 LAB — TSH: TSH: 1.427 u[IU]/mL (ref 0.350–4.500)

## 2022-03-27 MED ORDER — CHLORDIAZEPOXIDE HCL 25 MG PO CAPS
25.0000 mg | ORAL_CAPSULE | Freq: Three times a day (TID) | ORAL | Status: AC
  Administered 2022-03-29 (×3): 25 mg via ORAL
  Filled 2022-03-27 (×3): qty 1

## 2022-03-27 MED ORDER — ALUM & MAG HYDROXIDE-SIMETH 200-200-20 MG/5ML PO SUSP: 30.0000 mL | ORAL | Status: DC | PRN

## 2022-03-27 MED ORDER — MAGNESIUM HYDROXIDE 400 MG/5ML PO SUSP: 30.0000 mL | Freq: Every day | ORAL | Status: DC | PRN

## 2022-03-27 MED ORDER — HYDROXYZINE HCL 25 MG PO TABS
25.0000 mg | ORAL_TABLET | Freq: Four times a day (QID) | ORAL | Status: AC | PRN
  Filled 2022-03-27: qty 1

## 2022-03-27 MED ORDER — GABAPENTIN 100 MG PO CAPS
100.0000 mg | ORAL_CAPSULE | Freq: Two times a day (BID) | ORAL | Status: DC
  Administered 2022-03-27 – 2022-03-31 (×8): 100 mg via ORAL
  Filled 2022-03-27 (×8): qty 1

## 2022-03-27 MED ORDER — ACETAMINOPHEN 325 MG PO TABS: 650.0000 mg | ORAL_TABLET | Freq: Four times a day (QID) | ORAL | Status: DC | PRN

## 2022-03-27 MED ORDER — ONDANSETRON 4 MG PO TBDP: 4.0000 mg | ORAL_TABLET | Freq: Four times a day (QID) | ORAL | Status: AC | PRN

## 2022-03-27 MED ORDER — CHLORDIAZEPOXIDE HCL 25 MG PO CAPS
25.0000 mg | ORAL_CAPSULE | ORAL | Status: AC
  Administered 2022-03-30 (×2): 25 mg via ORAL
  Filled 2022-03-27 (×2): qty 1

## 2022-03-27 MED ORDER — ADULT MULTIVITAMIN W/MINERALS CH
1.0000 | ORAL_TABLET | Freq: Every day | ORAL | Status: DC
  Administered 2022-03-27 – 2022-03-31 (×5): 1 via ORAL
  Filled 2022-03-27 (×5): qty 1

## 2022-03-27 MED ORDER — LOPERAMIDE HCL 2 MG PO CAPS: 2.0000 mg | ORAL_CAPSULE | ORAL | Status: AC | PRN

## 2022-03-27 MED ORDER — CHLORDIAZEPOXIDE HCL 25 MG PO CAPS: 25.0000 mg | ORAL_CAPSULE | Freq: Four times a day (QID) | ORAL | Status: AC | PRN

## 2022-03-27 MED ORDER — CLONIDINE HCL 0.1 MG PO TABS
0.1000 mg | ORAL_TABLET | Freq: Two times a day (BID) | ORAL | Status: DC
  Administered 2022-03-27 – 2022-03-29 (×4): 0.1 mg via ORAL
  Filled 2022-03-27 (×4): qty 1

## 2022-03-27 MED ORDER — THIAMINE HCL 100 MG/ML IJ SOLN
100.0000 mg | Freq: Once | INTRAMUSCULAR | Status: AC
  Administered 2022-03-27: 100 mg via INTRAMUSCULAR
  Filled 2022-03-27: qty 2

## 2022-03-27 MED ORDER — DIVALPROEX SODIUM 500 MG PO DR TAB
500.0000 mg | DELAYED_RELEASE_TABLET | Freq: Two times a day (BID) | ORAL | Status: DC
  Administered 2022-03-27 – 2022-03-31 (×8): 500 mg via ORAL
  Filled 2022-03-27 (×8): qty 1

## 2022-03-27 MED ORDER — CHLORDIAZEPOXIDE HCL 25 MG PO CAPS
25.0000 mg | ORAL_CAPSULE | Freq: Four times a day (QID) | ORAL | Status: AC
  Administered 2022-03-27 – 2022-03-28 (×6): 25 mg via ORAL
  Filled 2022-03-27 (×6): qty 1

## 2022-03-27 MED ORDER — TRAZODONE HCL 50 MG PO TABS
50.0000 mg | ORAL_TABLET | Freq: Every evening | ORAL | Status: DC | PRN
  Administered 2022-03-28 – 2022-03-30 (×3): 50 mg via ORAL
  Filled 2022-03-27 (×4): qty 1

## 2022-03-27 MED ORDER — CHLORDIAZEPOXIDE HCL 25 MG PO CAPS
25.0000 mg | ORAL_CAPSULE | Freq: Every day | ORAL | Status: AC
  Administered 2022-03-31: 25 mg via ORAL
  Filled 2022-03-27: qty 1

## 2022-03-27 NOTE — ED Notes (Signed)
Report given to [facility] LPN on [facility]

## 2022-03-27 NOTE — ED Notes (Signed)
Pt sleeping@this time. Breathing even and unlabored. Will continue to monitor for safety 

## 2022-03-27 NOTE — Social Work (Signed)
The patient is a walk in wanting Detox, the patient denies HI SI use of alcohol drank a case of beer this am is wanting help

## 2022-03-27 NOTE — BH Assessment (Signed)
Comprehensive Clinical Assessment (CCA) Note  03/27/2022 Peter Waller [id number]  Disposition: Thomes Lolling, NP recommends [facility] for Detox  Peter Waller is a 37 yo married male who presents to [facility] with request for alcohol detox. He was accompanied to [facility] by his wife, Peter Waller [phone]). Peter Waller reports he earlier presented to [facility] for Detox and was referred to [facility] due to elevated BP.  Peter Waller has a hx of alcohol dependence and seizures. He reports he stopped all medications except Depakote, due to not wanting to take any medications that aren't necessary. Peter Waller reports he had 3 years of sobriety that was helped with a medication that made him sick to stomach when he smelled alcohol. He is interested in taking this medication again but doesn't know the name of it. Pt denies current SI. He denies past suicide attempts. Pt acknowledges sx of depression, including feelings of guilt, decreased sleep, and social isolating. Pt denies auditory and visual hallucinations and other sx of psychosis. Current stressors include recent loss of employment.  Peter Waller lives with his wife and 5 children. He denies hx of abuse and trauma but stated he understands the damage not talking about it can cause. Pt has fair insight and judgement. Memory appears largely intact, with gaps for time frames of his life events. Pt denies current legal charges or problems.   Protective factors against suicide include family support, obligation to his children, no current SI, no prior attempt, no psychosis, denial of access to weapons and future orientation.  MSE: Peter Waller is casually dressed, oriented x 4 with slightly slowed speech and normal motor behavior. No tremor noted. Client stated he felt shaky. Eye contact is good. Mood is dysthymic and anxious. Affect is congruent with mood. Thought process is coherent and relevant. There is no indication pt is currently responding to internal stmuli or experiencing delusional thought  content. Peter Waller was cooperative throughout assessment.   The patient demonstrates the following risk factors for suicide: Chronic risk factors for suicide include: substance use disorder. Acute risk factors for suicide include: unemployment, social withdrawal/isolation, and loss (financial, interpersonal, professional). Protective factors for this patient include: positive social support, responsibility to others (children, family), and religious beliefs against suicide. Considering these factors, the overall suicide risk at this point appears to be low. Patient is appropriate for outpatient follow up.   [facility] from 03/27/2022 in [place] No Risk          Chief Complaint:  Chief Complaint  Patient presents with   Alcohol Problem   Visit Diagnosis: Alcohol Dependence   CCA Screening, Triage and Referral (STR)  Patient Reported Information How did you hear about [place]? Family/Friend  What Is the Reason for Your Visit/Call Today? The patient is a walk in wanting Detox, the patient denies HI SI use of alcohol drank a case of beer.  How Long Has This Been Causing You Problems? 1-6 months  What Do You Feel Would Help You the Most Today? Alcohol or Drug Use Treatment   Have You Recently Had Any Thoughts About Hurting Yourself? No  Are You Planning to Commit Suicide/Harm Yourself At This time? No     Have you Recently Had Thoughts About [place]? No  Are You Planning to Harm Someone at This Time? No  Explanation: No data recorded  Have You Used Any Alcohol or Drugs in the Past 24 Hours? Yes  What Did You Use and How Much? a  whole case of beer   Do You Currently Have a Therapist/Psychiatrist? No Name of Therapist/Psychiatrist:  n/a  Have You Been Recently Discharged From Any Office Practice or Programs? No Explanation of Discharge From Practice/Program:n/a    CCA Screening Triage Referral  Assessment Type of Contact: Face to face Telemedicine Service Delivery:   Is this Initial or Reassessment?  Initial Date Telepsych consult ordered in [facility]:    Time Telepsych consult ordered in [facility]:    Location of Assessment: [place] Provider Location: [place]  Collateral Involvement: None  Does Patient Have a [place]? No Legal Guardian Contact Information: n/a Copy of Legal Guardianship Form: n/a Legal Guardian Notified of Arrival: n/a Legal Guardian Notified of Pending Discharge: n/a If Minor and Not Living with Parent(s), Who has Custody? N/a Is CPS involved or ever been involved? unknown Is APS involved or ever been involved? unknown  Patient Determined To Be At Risk for Harm To Self or Others Based on Review of Patient Reported Information or Presenting Complaint? no Method: n/a Availability of Means: n/a Intent: n/a Notification Required: no Additional Information for Danger to Others Potential:no Additional Comments for Danger to Others Potential: no Are There Guns or Other Weapons in [place]? no Types of Guns/Weapons: n/a Are These Weapons Safely Secured?                            N/a Who Could Verify You Are Able To Have These Secured: n/a Do You Have any Outstanding Charges, Pending Court Dates, Parole/Probation? No Contacted To Inform of Risk of Harm To Self or Others:n/a   Does Patient Present under Involuntary Commitment? no   [place] of Residence: [place]  Patient Currently Receiving the Following Services: None  Determination of Need: Urgent (48 hours)   Options For Referral: Inpatient Hospitalization     CCA Biopsychosocial Patient Reported Schizophrenia/Schizoaffective Diagnosis in Past: No   Strengths: supportive spouse, past sobriety of 3 years, faith, wants to do well for his children   Mental Health Symptoms Depression:   Difficulty Concentrating; Change in energy/activity; Fatigue; Hopelessness; Irritability; Sleep  (too much or little)   Duration of Depressive symptoms:  Duration of Depressive Symptoms: Greater than two weeks   Mania:   None   Anxiety:    Difficulty concentrating; Fatigue; Irritability; Restlessness; Sleep; Tension; Worrying   Psychosis:   None   Duration of Psychotic symptoms:    Trauma:   N/A   Obsessions:   N/A   Compulsions:   N/A   Inattention:   N/A   Hyperactivity/Impulsivity:   N/A   Oppositional/Defiant Behaviors:   N/A   Emotional Irregularity:   N/A   Other Mood/Personality Symptoms:  No data recorded   Mental Status Exam Appearance and self-care  Stature:   Average   Weight:   Average weight   Clothing:   Casual   Grooming:   Neglected   Cosmetic use:   None   Posture/gait:   Slumped; Normal   Motor activity:   Slowed   Sensorium  Attention:   Normal   Concentration:   Variable   Orientation:   Situation; Place; Person; Object   Recall/memory:   Defective in Remote   Affect and Mood  Affect:   Anxious; Appropriate; Congruent; Constricted   Mood:   Anxious; Dysphoric   Relating  Eye contact:  No data recorded  Facial expression:   Anxious; Responsive; Sad   Attitude toward  examiner:   Cooperative   Thought and Language  Speech flow:  Slow; Normal   Thought content:   Appropriate to Mood and Circumstances   Preoccupation:   None   Hallucinations:   None   Organization:   Logical; Coherent   [employer] of Knowledge:   Average   Intelligence:   Average   Abstraction:   Normal   Judgement:   Fair   Programmer, systems   Insight:   Fair   Decision Making:   Normal   Social Functioning  Social Maturity:   Isolates; Irresponsible; Responsible   Social Judgement:   Normal   Stress  Stressors:   Surveyor, quantity; Work   Coping Ability:   Deficient supports   Skill Deficits:   Self-care   Supports:   Family; Friends/Service system      Religion: Religion/Spirituality Are You A Religious Person?: Yes What is Your Religious Affiliation?: Chiropodist: Leisure / Recreation Do You Have Hobbies?: Yes Leisure and Hobbies: time with children and friends  Exercise/Diet: Exercise/Diet Do You Exercise?: No Have You Gained or Lost A Significant Amount of Weight in the Past Six Months?: No Do You Follow a Special Diet?: No Do You Have Any Trouble Sleeping?: Yes Explanation of Sleeping Difficulties: sleeps 3-8 hours q hs "just varies"   CCA Employment/Education Employment/Work Situation: Employment / Work Situation Employment Situation: Unemployed Patient's Job has Been Impacted by Current Illness: Yes Describe how Patient's Job has Been Impacted: Patient lost his job 3 months ago after relapse on alcohol (3 years of sobriety) Has Patient ever Been in the [employer]?: No  Education: Education Is Patient Currently Attending School?: No Last Grade Completed:  (unknown) Did You Attend College?: No Did You Have An Individualized Education Program (IIEP):  (unknown) Did You Have Any Difficulty At School?:  (unknown) Patient's Education Has Been Impacted by Current Illness: No   CCA Family/Childhood History Family and Relationship History: Family history Marital status: Married Number of Years Married:  (Unknown) What types of issues is patient dealing with in the relationship?: Stress after relapse on alcohol Does patient have children?: Yes How many children?: 5 How is patient's relationship with their children?: Pt states his children are very important to him. He wants to be strong for them.  Childhood History:  Childhood History By whom was/is the patient raised?:  (unknown) Did patient suffer any verbal/emotional/physical/sexual abuse as a child?: No Did patient suffer from severe childhood neglect?: No Has patient ever been sexually abused/assaulted/raped as an adolescent or adult?:  No Was the patient ever a victim of a crime or a disaster?: No Witnessed domestic violence?:  (unknown) Has patient been affected by domestic violence as an adult?:  (unknown)       CCA Substance Use Alcohol/Drug Use: Alcohol / Drug Use Pain Medications: None  Prescriptions: depakote- not any of his other prescription medications Over the Counter: None  History of alcohol / drug use?: Yes Longest period of sobriety (when/how long): 2003--pt in detox treatment  Negative Consequences of Use: Work / Programmer, multimedia, Copywriter, advertising relationships, Armed forces operational officer, Surveyor, quantity Withdrawal Symptoms: Nausea / Vomiting ("shaky" per client; tremors not observed) Substance #1 Name of Substance 1: beer 1 - Age of First Use: 13 1 - Amount (size/oz): 1-2 cases 1 - Frequency: daily 1 - Duration: years 1 - Last Use / Amount: half case of beer this morning 1 - Method of Aquiring: purchase 1- Route of Use: oral  ASAM's:  Six Dimensions of Multidimensional Assessment  Dimension 1:  Acute Intoxication and/or Withdrawal Potential:   Dimension 1:  Description of individual's past and current experiences of substance use and withdrawal: daily alcohol use of up to 2 cases of beer; hx of seizures; lost job 3 months ago  Dimension 2:  Biomedical Conditions and Complications:   Dimension 2:  Description of patient's biomedical conditions and  complications: hx of seizures  Dimension 3:  Emotional, Behavioral, or Cognitive Conditions and Complications:  Dimension 3:  Description of emotional, behavioral, or cognitive conditions and complications: Pt reports sx of depression and anxiety- social withdrawl, feelings of guilt, worry  Dimension 4:  Readiness to Change:  Dimension 4:  Description of Readiness to Change criteria: Pt states he wants detox and stop drinking alcohol  Dimension 5:  Relapse, Continued use, or Continued Problem Potential:  Dimension 5:  Relapse, continued use, or continued problem  potential critiera description: Client stopped taking mental health medications on his own  Dimension 6:  Recovery/Living Environment:     ASAM Severity Score: ASAM's Severity Rating Score: 8  ASAM Recommended Level of Treatment: ASAM Recommended Level of Treatment: Level III Residential Treatment   Substance use Disorder (SUD) Substance Use Disorder (SUD)  Checklist Symptoms of Substance Use: Continued use despite having a persistent/recurrent physical/psychological problem caused/exacerbated by use, Continued use despite persistent or recurrent social, interpersonal problems, caused or exacerbated by use, Evidence of tolerance, Evidence of withdrawal (Comment), Large amounts of time spent to obtain, use or recover from the substance(s), Persistent desire or unsuccessful efforts to cut down or control use, Presence of craving or strong urge to use, Recurrent use that results in a failure to fulfill major role obligations (work, school, home), Social, occupational, recreational activities given up or reduced due to use, Substance(s) often taken in larger amounts or over longer times than was intended  Recommendations for Services/Supports/Treatments: Recommendations for Services/Supports/Treatments Recommendations For Services/Supports/Treatments: Detox, Brewing technologist, Individual Therapy, Medication Management, Residential-Level 1, SAIOP (Substance Abuse Intensive Outpatient Program)  Discharge Disposition:    DSM5 Diagnoses: Patient Active Problem List   Diagnosis Date Noted   First time seizure ([place])      Referrals to Alternative Service(s): Referred to Alternative Service(s):   Place:   Date:   Time:    Referred to Alternative Service(s):   Place:   Date:   Time:    Referred to Alternative Service(s):   Place:   Date:   Time:    Referred to Alternative Service(s):   Place:   Date:   Time:     Mallika Sanmiguel Tora Perches, LCSW
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
## 2022-03-27 NOTE — ED Notes (Signed)
Pt admitted to [facility] requesting detox from alcohol.Patient was cooperative during the admission assessment. Pt currently denies SI,HI,AVH. Skin assessment complete. Belongings in the locker. Patient oriented to unit and unit rules. Meal and drinks offered to patient,pt refused.  Patient verbalized agreement to treatment plans. Patient verbally contracts for safety while hospitalized. Will monitor for safety.

## 2022-03-27 NOTE — ED Notes (Signed)
Patient observed/assessed in room in bed appearing in no immediate distress resting peacefully. Q15 minute checks continued by MHT and nursing staff. Will continue to monitor and support. 

## 2022-03-28 DIAGNOSIS — F1010 Alcohol abuse, uncomplicated: Secondary | ICD-10-CM | POA: Diagnosis not present

## 2022-03-28 DIAGNOSIS — F329 Major depressive disorder, single episode, unspecified: Secondary | ICD-10-CM | POA: Diagnosis not present

## 2022-03-28 DIAGNOSIS — F419 Anxiety disorder, unspecified: Secondary | ICD-10-CM | POA: Diagnosis not present

## 2022-03-28 DIAGNOSIS — Z1152 Encounter for screening for COVID-19: Secondary | ICD-10-CM | POA: Diagnosis not present

## 2022-03-28 LAB — RPR: RPR Ser Ql: NONREACTIVE

## 2022-03-28 NOTE — Tx Team (Signed)
LCSW met with patient to assess current mood, affect, physical state, and inquire about needs/goals while here in [facility] and after discharge. Patient reports he presented due to it being "time for a change". Patient reports he has been struggling with alcohol since the age of 52 and reports he needs to make better decisions for himself. Patient reports he drinks about one to two 24 pack beers a day. Patient denies any other substance use other than tobacco and reports he smokes about 10 cigarettes a day. Patient reports an increase in his alcohol use after becoming unemployed about 3 months ago. LCSW was not informed of reason. Patient reports he has attempted to seek treatment multiples in the past, however reports little success in completing the programs. Patient reports his last admission was about 6 months ago at a detox facility in [place]; 3-4 years ago at a hospital in [place]; 10 years ago spent 14days at [facility]; and reports about 15 years ago he was admitted to [facility] for alcohol use. Patient reports his current goal is to get stabilized on medication and continue outpatient treatment. Patient reports he received outpatient services from [facility] in the past, however reports stopping his meds about 3 years ago. Patient reports he is not opposed to following up with [facility] again at discharge. Patient reports he was provided some AA resources by a provider at the [facility] that he also plans to follow up with. Patient has provided LCSW with permission to speak with his wife Pollyann Savoy as needed [phone]. Patient reports he lives at home with his wife and three kids. Patient denies having access to his own transportation, however reports having good social support. Patient currently denies any SI/HI/AVH and reports mood as "shitty needing to detox". Patient aware that LCSW will send referral out for request of outpatient services and will follow up to provide updates as received. Patient  expressed understanding and appreciation of LCSW assistance. No other needs were reported at this time by patient.    Lucius Conn, LCSW Clinical Social Worker [facility] Ph: [phone]

## 2022-03-28 NOTE — ED Notes (Signed)
Patient observed/assessed in room in bed appearing in no immediate distress resting peacefully. Q15 minute checks continued by MHT and nursing staff. Will continue to monitor and support. 

## 2022-03-28 NOTE — ED Notes (Signed)
Pt requested to have his Depakote at 0600 to avoid the risk of him having a seizure. Pt stated he always takes it around 0600  when he is at home. Provider NP Carloyn Manner notified if medication can be moved from 1000 to 0600 as requested by pt. Np Carloyn Manner  stated it okay to move medication to 0600 as requested by pt.

## 2022-03-28 NOTE — ED Notes (Signed)
Patient refused to attend group.

## 2022-03-28 NOTE — Discharge Instructions (Signed)
List of Residential placements:   [facility]: [phone]  [facility]: [place], [place] [phone]: Male and male facility; 30-day program: (uninsured and Medicaid such as [facility], [facility], [facility], partners)  [facility]: [phone]; men and women's facility; 28 days; Can have Medicaid tailored plan ([facility] or [facility])  [facility]: [phone] Angie or [place]; 28 day program; must be fully detox; tailored Medicaid or no insurance  [facility] in [place], [place]; [phone]; 28 day all males program; no insurance accepted  [facility] in [place]: [place] [phone] (no insurance or Medicaid only); 90 days; outpatient services but provide housing in apartments [place]  [facility] Admission: [phone]: Patient must complete phone screening for placement: [place], [place]; 6 month program; uninsured, Medicaid, and [facility] insurance.   [facility]: no insurance required; [phone]  [facility] [facility]: [phone]; Intake: Robert; Must fill out application online; Victor Delay [phone] x [phone]  [facility] in [place], [place]: [phone]; Admissions Coordinators Mr. Dennis or Kelden Gibson; 90 day program.  [facility]: [place], [place] [phone]; Co-Ed 9 month to a year program; Online application; Men entry fee is $500 (6-12months);  [facility]: [address] [place], [place] [address]; no fee or insurance required; minimum of 2 years; Highly structured; work based; Intake Coordinator is Chris [phone]  [facility] in [place], [place]: [phone]; Fax number is [phone]; website: [facility].org; Requires 3-6 page autobiography; 2 year program (18 months and then 6month transitional housing); Admission fee is $300; no insurance needed; work program  [facility] in [place], [place]: Front Desk Staff: Reeci [phone]: They have a  Men's Regenerations Program 6-9months. Free program; There is an initial $300 fee however, they are willing to work with patients regarding that. Application is online.  [facility]: Admissions [phone] Benjamin Cox ext [phone]; Any 7-90 day program is out of pocket; 12 month program is free of charge; there is a $275 entry fee; Patient is responsible for own transportation   [facility] [address] [place], [place], [address] [phone] phone  New Patient Assessment/Therapy Walk-Ins:  Monday and Wednesday: 8 am until slots are full. Every 1st and 2nd Fridays of the month: 1 pm - 5 pm.  NO ASSESSMENT/THERAPY WALK-INS ON TUESDAYS OR THURSDAYS  New Patient Assessment/Medication Management Walk-Ins:  Monday - Friday:  8 am - 11 am.  For all walk-ins, we ask that you arrive by 7:30 am because patients will be seen in the order of arrival.  Availability is limited; therefore, you may not be seen on the same day that you walk-in.  Our goal is to serve and meet the needs of our community to the best of our ability.  SUBSTANCE USE TREATMENT for Medicaid and State Funded/IPRS  [facility]) [address] [place], [place], [address] [phone] phone NOTE: [facility] is no longer offering IOP services.  Serves those who are low-income or have no insurance.  [facility] [address], [place], [place], [address] [phone] phone [phone] fax NOTE: Does have Substance Abuse-Intensive Outpatient Program (SAIOP) as well as transitional housing if eligible.  [facility] [address] [place], , [address] [phone] phone [phone] fax  [facility] [address] [place], , [address] [phone] phone [phone] fax  HALFWAY HOUSES:  [facility] [phone]  [facility] www.oxfordvacancies.com  12 STEP PROGRAMS:  Alcoholics Anonymous of [place]  https://aagreensboronc.com/meeting  Narcotics Anonymous of [place] https://greensborona.org/meetings/    Al-Anon of [place], [place] www.greens[place]alanon.org/find-meetings.html  Nar-Anon https://nar-anon.org/find-a-meetin 

## 2022-03-28 NOTE — ED Notes (Signed)
Patient observed/assessed in patient's room. Patient alert and oriented x 4. Affect is flat.  Patient denies pain and anxiety. He denies A/V/H. He denies having any thoughts/plan of self harm and harm towards others. Fluid and snack offered. Patient states that appetite has been good throughout the day.  Verbalizes no further complaints at this time. Will continue to monitor and support.

## 2022-03-28 NOTE — ED Notes (Signed)
Pt is dayroom watching TV. Respirations are even and unlabored. No acute distress noted. Will continue to monitor for safety.

## 2022-03-28 NOTE — ED Notes (Signed)
Patient accepted scheduled meds w/o difficulty. Breakfast provided. Patient currently resting in assigned room in no distress. Safety maintained and will continue to monitor.

## 2022-03-29 DIAGNOSIS — F1010 Alcohol abuse, uncomplicated: Secondary | ICD-10-CM | POA: Diagnosis not present

## 2022-03-29 DIAGNOSIS — F419 Anxiety disorder, unspecified: Secondary | ICD-10-CM | POA: Diagnosis not present

## 2022-03-29 DIAGNOSIS — Z1152 Encounter for screening for COVID-19: Secondary | ICD-10-CM | POA: Diagnosis not present

## 2022-03-29 DIAGNOSIS — F329 Major depressive disorder, single episode, unspecified: Secondary | ICD-10-CM | POA: Diagnosis not present

## 2022-03-29 DIAGNOSIS — F1090 Alcohol use, unspecified, uncomplicated: Secondary | ICD-10-CM

## 2022-03-29 MED ORDER — SERTRALINE HCL 50 MG PO TABS
50.0000 mg | ORAL_TABLET | Freq: Every day | ORAL | Status: DC
  Administered 2022-03-29 – 2022-03-31 (×3): 50 mg via ORAL
  Filled 2022-03-29 (×3): qty 1

## 2022-03-29 MED ORDER — NALTREXONE HCL 50 MG PO TABS
50.0000 mg | ORAL_TABLET | Freq: Every day | ORAL | Status: DC
  Administered 2022-03-29 – 2022-03-31 (×3): 50 mg via ORAL
  Filled 2022-03-29 (×3): qty 1

## 2022-03-29 NOTE — ED Notes (Signed)
In group with Chaplin 

## 2022-03-29 NOTE — Discharge Planning (Signed)
LCSW spoke with MD regarding discharge date. Per MD, patient will likely be discharged once taper has been completed. Aftercare walk-in hours for [facility] in [place] have been provided in the patient's AVS for his follow up. First initial intake appointment must be completed via walk-in hours. Patient to be made aware. No other needs to report at this time.   Lucius Conn, LCSW Clinical Social Worker [facility] Ph: [phone]

## 2022-03-29 NOTE — ED Notes (Signed)
Pt sleeping in no acute distress. RR even and unlabored. Environment secured. Will continue to monitor for safety. 

## 2022-03-29 NOTE — ED Notes (Signed)
Pt is attending AA. 

## 2022-03-29 NOTE — ED Notes (Signed)
Pt is in the bed sleeping. Respirations are even and unlabored. No acute distress noted. Will continue to monitor for safety. 

## 2022-03-29 NOTE — ED Notes (Signed)
SPIRITUALITY GROUP NOTE  Spirituality group facilitated by Chaplain Febe Champa, MDiv, BCC.  Group Description:  Group focused on topic of hope.  Patients participated in facilitated discussion around topic, connecting with one another around experiences and definitions for hope.  Group members engaged with visual explorer photos, reflecting on what hope looks like for them today.  Group engaged in discussion around how their definitions of hope are present today in hospital.   Modalities: Psycho-social ed, Adlerian, Narrative, MI Patient Progress:  

## 2022-03-29 NOTE — ED Provider Notes (Signed)
Behavioral Health Progress Note  Date and Time: 03/29/2022 11:58 AM Name: Peter Waller MRN:  [id number]  Admission:  Peter Waller is a 37 yo male w/ hx of MDD, alcohol use disorder, seizures admitted to [facility] for alcohol detox.    Subjective: Patient reports he has had ongoing alcohol use, 1-2 24 packs of beer per day for the past 6 months. He reports prior history of detox/rehab. Per prior note, 6 months ago he did detox in [place], 3-4 years ago in [place], 10 years ago he did 14 days at [facility], and 15 years ago he was seen at [facility] for alcohol use. He reports that he went to [facility] for an oupatient visit and was referred to [facility] for detox. He also reports a prior history of seizures but was unable to clarify seizure history. Discussed with wife who reports that he stopped his depakote 6 months ago and his last seizure was 3-4 months ago. She is unclear if it is in the context of alcohol withdrawal. She reports that he has generalized movements for 3-4 minutes and then has confusion afterwards. He reports withdrawal symptoms of tremor previously.   He also reports ongoing tobacco use, reports he smokes 1/2 ppd. Discussed NRT and he was not interested at this time.   He is unclear on prior psychiatric diagnoses but reports he was previously prescribed zoloft at [facility]. Per wife, he was prescribed zoloft 50mg .   He denies current SI/HI/AVH. He denies issues with sleep/appetite.   Most recent CIWA 2 (tremor).  On Librium taper (day 2/4)   Diagnosis:  Final diagnoses:  Alcohol abuse  Alcohol use disorder  Seizures (HCC)  Major depressive disorder with current active episode, unspecified depression episode severity, unspecified whether recurrent    Total Time spent with patient: 45 minutes  Past Psychiatric History: MDD, alcohol use disorder, tobacco use disorder Past Medical History: history of seizures  Family History: unknown Family Psychiatric  History: unknown Social History:  lives with wife and 5 children. Patient lost his job 3 months ago after relapse on alcohol (3 years of sobriety)    Additional Social History:    Pain Medications: None  Prescriptions: depakote- not any of his other prescription medications Over the Counter: None  History of alcohol / drug use?: Yes Longest period of sobriety (when/how long): 2003--pt in detox treatment  Negative Consequences of Use: Work / Youth worker, Charity fundraiser relationships, Scientist, research (physical sciences), Museum/gallery curator Withdrawal Symptoms: Nausea / Vomiting ("shaky" per client; tremors not observed) Name of Substance 1: beer 1 - Age of First Use: 13 1 - Amount (size/oz): 1-2 cases 1 - Frequency: daily 1 - Duration: years 1 - Last Use / Amount: half case of beer this morning 1 - Method of Aquiring: purchase 1- Route of Use: oral                  Sleep: Good  Appetite:  Good  Current Medications:  Current Facility-Administered Medications  Medication Dose Route Frequency Provider Last Rate Last Admin   acetaminophen (TYLENOL) tablet 650 mg  650 mg Oral Q6H PRN Revonda Humphrey, NP       acetaminophen (TYLENOL) tablet 650 mg  650 mg Oral Q6H PRN Evette Georges, NP       alum & mag hydroxide-simeth (MAALOX/MYLANTA) 200-200-20 MG/5ML suspension 30 mL  30 mL Oral Q4H PRN Revonda Humphrey, NP       alum & mag hydroxide-simeth (MAALOX/MYLANTA) 200-200-20 MG/5ML suspension 30 mL  30 mL Oral  Q4H PRN Sindy Guadeloupe, NP       chlordiazePOXIDE (LIBRIUM) capsule 25 mg  25 mg Oral Q6H PRN Ardis Hughs, NP       chlordiazePOXIDE (LIBRIUM) capsule 25 mg  25 mg Oral TID Ardis Hughs, NP   25 mg at 03/29/22 2440   Followed by   Melene Muller ON 03/30/2022] chlordiazePOXIDE (LIBRIUM) capsule 25 mg  25 mg Oral BH-qamhs Ardis Hughs, NP       Followed by   Melene Muller ON 03/31/2022] chlordiazePOXIDE (LIBRIUM) capsule 25 mg  25 mg Oral Daily Ardis Hughs, NP       divalproex (DEPAKOTE) DR tablet 500 mg  500 mg Oral BID Ardis Hughs, NP   500  mg at 03/29/22 0600   gabapentin (NEURONTIN) capsule 100 mg  100 mg Oral BID Ardis Hughs, NP   100 mg at 03/29/22 1027   hydrOXYzine (ATARAX) tablet 25 mg  25 mg Oral Q6H PRN Ardis Hughs, NP       loperamide (IMODIUM) capsule 2-4 mg  2-4 mg Oral PRN Ardis Hughs, NP       magnesium hydroxide (MILK OF MAGNESIA) suspension 30 mL  30 mL Oral Daily PRN Ardis Hughs, NP       magnesium hydroxide (MILK OF MAGNESIA) suspension 30 mL  30 mL Oral Daily PRN Sindy Guadeloupe, NP       multivitamin with minerals tablet 1 tablet  1 tablet Oral Daily Ardis Hughs, NP   1 tablet at 03/29/22 0934   naltrexone (DEPADE) tablet 50 mg  50 mg Oral Daily Karie Fetch, MD   50 mg at 03/29/22 1132   ondansetron (ZOFRAN-ODT) disintegrating tablet 4 mg  4 mg Oral Q6H PRN Ardis Hughs, NP       sertraline (ZOLOFT) tablet 50 mg  50 mg Oral Daily Karie Fetch, MD   50 mg at 03/29/22 1132   traZODone (DESYREL) tablet 50 mg  50 mg Oral QHS PRN Ardis Hughs, NP   50 mg at 03/28/22 2118   Current Outpatient Medications  Medication Sig Dispense Refill   divalproex (DEPAKOTE) 500 MG DR tablet Take 500 mg by mouth 2 (two) times daily.      Labs  Lab Results:  Admission on 03/27/2022  Component Date Value Ref Range Status   SARS Coronavirus 2 by RT PCR 03/27/2022 NEGATIVE  NEGATIVE Final   Influenza A by PCR 03/27/2022 NEGATIVE  NEGATIVE Final   Influenza B by PCR 03/27/2022 NEGATIVE  NEGATIVE Final   Comment: (NOTE) The Xpert Xpress SARS-CoV-2/FLU/RSV plus assay is intended as an aid in the diagnosis of influenza from Nasopharyngeal swab specimens and should not be used as a sole basis for treatment. Nasal washings and aspirates are unacceptable for Xpert Xpress SARS-CoV-2/FLU/RSV testing.  Fact Sheet for Patients: BloggerCourse.com  Fact Sheet for Healthcare Providers: SeriousBroker.it  This test is not yet approved  or cleared by the [place] FDA and has been authorized for detection and/or diagnosis of SARS-CoV-2 by FDA under an Emergency Use Authorization (EUA). This EUA will remain in effect (meaning this test can be used) for the duration of the COVID-19 declaration under Section 564(b)(1) of the Act, 21 U.S.C. section 360bbb-3(b)(1), unless the authorization is terminated or revoked.     Resp Syncytial Virus by PCR 03/27/2022 NEGATIVE  NEGATIVE Final   Comment: (NOTE) Fact Sheet for Patients: BloggerCourse.com  Fact Sheet for Healthcare Providers: SeriousBroker.it  This test  is not yet approved or cleared by the [place] and has been authorized for detection and/or diagnosis of SARS-CoV-2 by FDA under an Emergency Use Authorization (EUA). This EUA will remain in effect (meaning this test can be used) for the duration of the COVID-19 declaration under Section 564(b)(1) of the Act, 21 U.S.C. section 360bbb-3(b)(1), unless the authorization is terminated or revoked.  Performed at [facility], [address], [place], [place] [address]    WBC 03/27/2022 5.2  4.0 - 10.5 K/uL Final   RBC 03/27/2022 4.38  4.22 - 5.81 MIL/uL Final   Hemoglobin 03/27/2022 15.4  13.0 - 17.0 g/dL Final   HCT 01/75/1025 43.3  39.0 - 52.0 % Final   MCV 03/27/2022 98.9  80.0 - 100.0 fL Final   MCH 03/27/2022 35.2 (H)  26.0 - 34.0 pg Final   MCHC 03/27/2022 35.6  30.0 - 36.0 g/dL Final   RDW 85/27/7824 14.1  11.5 - 15.5 % Final   Platelets 03/27/2022 234  150 - 400 K/uL Final   nRBC 03/27/2022 0.0  0.0 - 0.2 % Final   Neutrophils Relative % 03/27/2022 49  % Final   Neutro Abs 03/27/2022 2.6  1.7 - 7.7 K/uL Final   Lymphocytes Relative 03/27/2022 41  % Final   Lymphs Abs 03/27/2022 2.1  0.7 - 4.0 K/uL Final   Monocytes Relative 03/27/2022 8  % Final   Monocytes Absolute 03/27/2022 0.4  0.1 - 1.0 K/uL Final   Eosinophils Relative 03/27/2022 1   % Final   Eosinophils Absolute 03/27/2022 0.1  0.0 - 0.5 K/uL Final   Basophils Relative 03/27/2022 1  % Final   Basophils Absolute 03/27/2022 0.1  0.0 - 0.1 K/uL Final   Immature Granulocytes 03/27/2022 0  % Final   Abs Immature Granulocytes 03/27/2022 0.01  0.00 - 0.07 K/uL Final   Performed at [facility], [address], [place], [place] [address]   Sodium 03/27/2022 139  135 - 145 mmol/L Final   Potassium 03/27/2022 4.1  3.5 - 5.1 mmol/L Final   Chloride 03/27/2022 100  98 - 111 mmol/L Final   CO2 03/27/2022 26  22 - 32 mmol/L Final   Glucose, Bld 03/27/2022 79  70 - 99 mg/dL Final   Glucose reference range applies only to samples taken after fasting for at least 8 hours.   BUN 03/27/2022 <5 (L)  6 - 20 mg/dL Final   Creatinine, Ser 03/27/2022 0.85  0.61 - 1.24 mg/dL Final   Calcium 14/43/1540 9.1  8.9 - 10.3 mg/dL Final   Total Protein 08/67/6195 7.3  6.5 - 8.1 g/dL Final   Albumin 09/32/6712 4.1  3.5 - 5.0 g/dL Final   AST 45/80/9983 46 (H)  15 - 41 U/L Final   ALT 03/27/2022 26  0 - 44 U/L Final   Alkaline Phosphatase 03/27/2022 44  38 - 126 U/L Final   Total Bilirubin 03/27/2022 0.5  0.3 - 1.2 mg/dL Final   GFR, Estimated 03/27/2022 >60  >60 mL/min Final   Comment: (NOTE) Calculated using the CKD-EPI Creatinine Equation (2021)    Anion gap 03/27/2022 13  5 - 15 Final   Performed at [facility], [address] [place], [place] [address]   Hgb A1c MFr Bld 03/27/2022 5.5  4.8 - 5.6 % Final   Comment: (NOTE) Pre diabetes:          5.7%-6.4%  Diabetes:              >  6.4%  Glycemic control for   <7.0% adults with diabetes    Mean Plasma Glucose 03/27/2022 111.15  mg/dL Final   Performed at [facility], [address] [place], [place] [address]   Magnesium 03/27/2022 2.0  1.7 - 2.4 mg/dL Final   Performed at [facility], [address] [place], [place] [address]   Alcohol, Ethyl (B) 03/27/2022 209 (H)  <10 mg/dL Final   Comment:  (NOTE) Lowest detectable limit for serum alcohol is 10 mg/dL.  For medical purposes only. Performed at [facility], [address] [place], [place] [address]    RPR Ser Ql 03/27/2022 NON REACTIVE  NON REACTIVE Final   Performed at [facility], [address], [place], [place] [address]   Color, Urine 03/27/2022 STRAW (A)  YELLOW Final   APPearance 03/27/2022 CLEAR  CLEAR Final   Specific Gravity, Urine 03/27/2022 1.004 (L)  1.005 - 1.030 Final   pH 03/27/2022 8.0  5.0 - 8.0 Final   Glucose, UA 03/27/2022 NEGATIVE  NEGATIVE mg/dL Final   Hgb urine dipstick 03/27/2022 NEGATIVE  NEGATIVE Final   Bilirubin Urine 03/27/2022 NEGATIVE  NEGATIVE Final   Ketones, ur 03/27/2022 NEGATIVE  NEGATIVE mg/dL Final   Protein, ur 32/35/5732 NEGATIVE  NEGATIVE mg/dL Final   Nitrite 20/25/4270 NEGATIVE  NEGATIVE Final   Leukocytes,Ua 03/27/2022 NEGATIVE  NEGATIVE Final   RBC / HPF 03/27/2022 0-5  0 - 5 RBC/hpf Final   WBC, UA 03/27/2022 0-5  0 - 5 WBC/hpf Final   Bacteria, UA 03/27/2022 NONE SEEN  NONE SEEN Final   Squamous Epithelial / HPF 03/27/2022 0-5  0 - 5 /HPF Final   Performed at [facility], [address] [place], [place] [address]   POC Amphetamine UR 03/27/2022 None Detected  NONE DETECTED (Cut Off Level 1000 ng/mL) Final   POC Secobarbital (BAR) 03/27/2022 None Detected  NONE DETECTED (Cut Off Level 300 ng/mL) Final   POC Buprenorphine (BUP) 03/27/2022 None Detected  NONE DETECTED (Cut Off Level 10 ng/mL) Final   POC Oxazepam (BZO) 03/27/2022 None Detected  NONE DETECTED (Cut Off Level 300 ng/mL) Final   POC Cocaine UR 03/27/2022 None Detected  NONE DETECTED (Cut Off Level 300 ng/mL) Final   POC Methamphetamine UR 03/27/2022 None Detected  NONE DETECTED (Cut Off Level 1000 ng/mL) Final   POC Morphine 03/27/2022 None Detected  NONE DETECTED (Cut Off Level 300 ng/mL) Final   POC Methadone UR 03/27/2022 None Detected  NONE DETECTED (Cut Off Level 300 ng/mL) Final   POC  Oxycodone UR 03/27/2022 None Detected  NONE DETECTED (Cut Off Level 100 ng/mL) Final   POC Marijuana UR 03/27/2022 None Detected  NONE DETECTED (Cut Off Level 50 ng/mL) Final   Valproic Acid Lvl 03/27/2022 59  50.0 - 100.0 ug/mL Final   Performed at [facility], [address], [place], [place] [address]   HIV Screen 4th Generation wRfx 03/27/2022 Non Reactive  Non Reactive Final   Performed at [facility], [address] [place], [place] [address]   Cholesterol 03/27/2022 184  0 - 200 mg/dL Final   Triglycerides 07/37/1062 71  <150 mg/dL Final   HDL 69/48/5462 132  >40 mg/dL Final   Total CHOL/HDL Ratio 03/27/2022 1.4  RATIO Final   VLDL 03/27/2022 14  0 - 40 mg/dL Final   LDL Cholesterol 03/27/2022 NOT CALCULATED  0 - 99 mg/dL Final   Performed at  [facility], [place] [address], [place], [place] [address]   TSH 03/27/2022 1.427  0.350 - 4.500 uIU/mL Final   Comment: Performed by a 3rd Generation assay with a functional sensitivity of <=0.01 uIU/mL. Performed at [facility], [place] [address] [place], [place] [address]     Blood Alcohol level:  Lab Results  Component Value Date   ETH 209 (H) 03/27/2022   ETH <10 66/07/3014    Metabolic Disorder Labs: Lab Results  Component Value Date   HGBA1C 5.5 03/27/2022   MPG 111.15 03/27/2022   No results found for: "PROLACTIN" Lab Results  Component Value Date   CHOL 184 03/27/2022   TRIG 71 03/27/2022   HDL 132 03/27/2022   CHOLHDL 1.4 03/27/2022   VLDL 14 03/27/2022   LDLCALC NOT CALCULATED 03/27/2022    Therapeutic Lab Levels: No results found for: "LITHIUM" Lab Results  Component Value Date   VALPROATE 59 03/27/2022   No results found for: "CBMZ"  Physical Findings   PHQ2-9    Flowsheet Row ED from 03/27/2022 in [facility]  PHQ-2 Total Score 2      Flowsheet Row ED from 03/27/2022 in [facility] No Risk         Musculoskeletal  Strength & Muscle Tone: within normal limits Gait & Station: normal Patient leans: N/A  Mental Status Exam   Appearance and Grooming: Patient is casually dressed and lying in bed. The patient has no noticeable scent or odor. Motor activity: The patient's movement speed was not observed; his gait was not observed during encounter. There was no notable abnormal facial movements and no notable abnormal extremity movements. Behavior: The patient appears in no acute distress, and during the interview, was calm, required frequent redirection, and behaving appropriately to scenario; he was able to follow commands and compliant to requests and made good eye contact. The patient did not appear internally or externally preoccupied. Attitude: Patient's attitude towards the interviewer was cooperative and open. Speech: The patient's speech was clear, fluent, with good articulation, and with appropriately placed inflections. The volume of his speech was normal and normal in quantity. The rate was normal with a normal rhythm. Responses were normal in latency. There were no abnormal patterns in speech. Mood: "Tired" Affect: Patient's affect is blunted with broad range and even fluctuations; his affect is appropriate for the topic of conversation with his stated mood.  ------------------------------------------------------------------------------------------------------------------------- Thought Content The patient experiences no hallucinations. The patient describes no delusional thoughts; he denies thought insertion, denies thought withdrawal, denies thought interruption, and denies thought broadcasting. Patient at the time of interview denies active suicidal intent and denies passive suicidal ideation; he denies homicidal intent. Thought Process The patient's thought process is goal-directed and circumstantial.  Insight The patient at the time of interview demonstrates fair  insight, as evidenced by acknowledgement of substance use disorder/s. Judgement The patient over the past 24 hours demonstrates fair judgement  Memory: limited  Executive Functions  Concentration: intact Attention Span: fair Recall: intact Fund of Knowledge: fair  Alertness/Orientation: alert and answering questions appropriately   Physical Exam  Constitutional:      Appearance: the patient is not toxic-appearing.  Pulmonary:     Effort: Pulmonary effort is normal.  Neurological:     General: No focal deficit present.     Mental Status: the patient is alert and answering questions appropriately.   Review of Systems  Respiratory:  Negative for shortness of breath.   Cardiovascular:  Negative for chest pain.  Gastrointestinal:  Negative for abdominal pain, constipation, diarrhea, nausea and vomiting.  Neurological:  Negative for headaches.   Blood pressure 110/66, pulse (!) 54, temperature 97.9 F (36.6 C), temperature source Tympanic, resp. rate 16, SpO2 98 %. There is no height or weight on file to calculate BMI.  Treatment Plan Summary: Daily contact with patient to assess and evaluate symptoms and progress in treatment, Medication management, and Plan    Peter Waller is a 37 yo male w/ hx of MDD, alcohol use disorder, seizures admitted to [facility] for alcohol detox. Started on librium for his alcohol use disorder. He is currently day 2/4 and tolerating taper. He appears to have minimal symptoms of alcohol withdrawal at this time. Most recent CIWA 2 for tremor. He has also been on depakote for his prior history of seizures. He has not followed up with a neurologist so will place ambulatory referral to neurology for his seizure history since it is unclear if it is always in the context of alcohol withdrawal. We will also start naltrexone for his alcohol use disorder. Will also restart his home zoloft for his MDD.    AST 46  Indirect bili 2.0 Alcohol level 209  Depakote level  59 A1c 5.5  UDS negative   Alcohol Use Disorder Hx of Seizures -Monitor for withdrawal (CIWA ranging 0-4) -Continue librium taper. Day 2/4. (complete on 2/10) -Continue gabapentin 100 mg bid for alcohol cravings -Continue depakote DR 500 mg bid for seizures -Start naltrexone for alcohol use disorder  -will place ambulatory referral to neurology at discharge   MDD -Restart home zoloft 50mg  for MDD   Dispo: home 2/10 after completion of alcohol detox   Rolanda Lundborg, MD, PGY-1  03/29/2022 11:58 AM

## 2022-03-29 NOTE — ED Notes (Signed)
Patient remains asleep in bed without issue or complaint.  Will monitor and provide safe supportive environment.

## 2022-03-29 NOTE — ED Notes (Signed)
Patient did not attend group.

## 2022-03-29 NOTE — ED Notes (Signed)
Patient informed that it is time for breakfast 

## 2022-03-29 NOTE — ED Provider Notes (Addendum)
FBC Progress Note  Date and Time: 03/28/22 10:07 AM Name: Peter Waller MRN:  [id number]  Reason For Admission: Peter Waller is a 37 yo male w/ hx of MDD, alcohol use disorder, seizures admitted to [facility] for alcohol detox.   Subjective:  Seen and assessed at bedside. Denies SI/HI/AVH. Denies mood symptoms. Reports doing "ok". Eating and sleeping well. Denies acute withdrawal symptoms at this time.  Diagnosis:  Final diagnoses:  Alcohol abuse    Total Time spent with patient: 45 minutes   Labs  Lab Results:     Latest Ref Rng & Units 03/27/2022    4:53 PM 01/24/2017   10:50 AM 01/24/2015    8:33 AM  CBC  WBC 4.0 - 10.5 K/uL 5.2  7.4  9.3   Hemoglobin 13.0 - 17.0 g/dL 15.4  15.3  15.9   Hematocrit 39.0 - 52.0 % 43.3  44.4  47.8   Platelets 150 - 400 K/uL 234  238  222       Latest Ref Rng & Units 03/27/2022    4:53 PM 01/24/2017   10:50 AM 01/24/2015    8:33 AM  CMP  Glucose 70 - 99 mg/dL 79  121  102   BUN 6 - 20 mg/dL 5  6  10    Creatinine 0.61 - 1.24 mg/dL 0.85  0.96  1.02   Sodium 135 - 145 mmol/L 139  136  140   Potassium 3.5 - 5.1 mmol/L 4.1  4.1  4.1   Chloride 98 - 111 mmol/L 100  99  105   CO2 22 - 32 mmol/L 26  25  28    Calcium 8.9 - 10.3 mg/dL 9.1  9.2  9.8   Total Protein 6.5 - 8.1 g/dL 7.3  7.3  6.8   Total Bilirubin 0.3 - 1.2 mg/dL 0.5  2.2  0.7   Alkaline Phos 38 - 126 U/L 44  53  42   AST 15 - 41 U/L 46  51  25   ALT 0 - 44 U/L 26  26  17      Physical Findings   PHQ2-9    Flowsheet Row ED from 03/27/2022 in [facility]  PHQ-2 Total Score 2      Flowsheet Row ED from 03/27/2022 in [place] No Risk        Musculoskeletal  Strength & Muscle Tone: within normal limits Gait & Station: normal Patient leans: N/A  Psychiatric Specialty Exam  Presentation  General Appearance:  Casual   Eye Contact: Fair   Speech: Clear and Coherent; Normal  Rate   Speech Volume: Normal   Handedness: Right    Mood and Affect  Mood: Anxious; Depressed   Affect: Congruent    Thought Process  Thought Processes: Coherent   Descriptions of Associations:Intact   Orientation:Full (Time, Place and Person)   Thought Content:Logical   Diagnosis of Schizophrenia or Schizoaffective disorder in past: No     Hallucinations:No data recorded  Ideas of Reference:None   Suicidal Thoughts:No data recorded  Homicidal Thoughts:No data recorded   Sensorium  Memory: Immediate Good; Recent Good; Remote Good   Judgment: Poor   Insight: Good    Executive Functions  Concentration: Good   Attention Span: Good   Recall: Good   Fund of Knowledge: Good   Language: Good    Psychomotor Activity  Psychomotor Activity:No data recorded   Assets  Assets: Communication Skills;  Desire for Improvement; Financial Resources/Insurance; Housing; Physical Health; Resilience; Social Support    Sleep  Sleep:No data recorded   Physical Exam  Physical Exam ROS Blood pressure 110/66, pulse (!) 54, temperature 97.9 F (36.6 C), temperature source Tympanic, resp. rate 16, SpO2 98 %. There is no height or weight on file to calculate BMI.  ASSESSMENT Peter Waller is a 37 yo male w/ hx of MDD, alcohol use disorder, seizures admitted to [facility] for alcohol detox. Patient states depakote is for seizures not mood instability.  PLAN Alcohol Use Disorder Hx of Seizures -Monitor for withdrawal (CIWA ranging 0-4) -Continue librium taper (complete on 2/10) -Discontinue Clonidine given BP running low/normal and unclear why it was started -Continue gabapentin 100 mg bid for alcohol cravings -Continue depakote DR 500 mg bid for seizures  Dispo: home on 2/10 once completed alcohol detox   France Ravens, MD 03/29/2022 10:07 AM

## 2022-03-30 DIAGNOSIS — F1010 Alcohol abuse, uncomplicated: Secondary | ICD-10-CM | POA: Diagnosis not present

## 2022-03-30 DIAGNOSIS — F419 Anxiety disorder, unspecified: Secondary | ICD-10-CM | POA: Diagnosis not present

## 2022-03-30 DIAGNOSIS — F329 Major depressive disorder, single episode, unspecified: Secondary | ICD-10-CM | POA: Diagnosis not present

## 2022-03-30 DIAGNOSIS — Z1152 Encounter for screening for COVID-19: Secondary | ICD-10-CM | POA: Diagnosis not present

## 2022-03-30 MED ORDER — TRAZODONE HCL 50 MG PO TABS: 50.0000 mg | ORAL_TABLET | Freq: Once | ORAL | Status: DC

## 2022-03-30 NOTE — ED Provider Notes (Signed)
Behavioral Health Progress Note  Date and Time: 03/30/2022 10:54 AM Name: Peter Waller MRN:  [id number]  Admission:  Peter Waller is a 37 yo male w/ hx of MDD, alcohol use disorder (1-2 24 packs of beer per day for past 6 months), seizures admitted to [facility] for alcohol detox.    Subjective: Patient initially seen sleeping in bed. Discussed with patient at his bedside. He reports he attended groups yesterday which were helpful. He reports learning about the different types of hope. Patient felt like he was out of withdrawal period and was anxious to discharge. Discussed with patient that he still had one more day of librium taper and given his seizure history would advise staying until he completed librium taper tomorrow. Patient eventually agreed to stay. Also discussed ambulatory referral to neurology on discharge. Discussed with patient the other medications that he is taking. Patient denies any side effects to medications. He denies SI/HI/AVH. Provided encouragement regarding his desire to abstain from alcohol. Denies issues with sleep/appetite.   Most recent CIWA 0, 0.  On Librium taper (day 3/4)   Diagnosis:  Final diagnoses:  Alcohol abuse  Alcohol use disorder  Seizures (HCC)  Major depressive disorder with current active episode, unspecified depression episode severity, unspecified whether recurrent    Total Time spent with patient: 45 minutes  Past Psychiatric History: MDD, alcohol use disorder, tobacco use disorder Past Medical History: history of seizures  Family History: unknown Family Psychiatric  History: unknown Social History: lives with wife and 5 children. Patient lost his job 3 months ago after relapse on alcohol (3 years of sobriety)    Additional Social History:    Pain Medications: None  Prescriptions: depakote- not any of his other prescription medications Over the Counter: None  History of alcohol / drug use?: Yes Longest period of sobriety (when/how  long): 2003--pt in detox treatment  Negative Consequences of Use: Work / Youth worker, Charity fundraiser relationships, Scientist, research (physical sciences), Museum/gallery curator Withdrawal Symptoms: Nausea / Vomiting ("shaky" per client; tremors not observed) Name of Substance 1: beer 1 - Age of First Use: 13 1 - Amount (size/oz): 1-2 cases 1 - Frequency: daily 1 - Duration: years 1 - Last Use / Amount: half case of beer this morning 1 - Method of Aquiring: purchase 1- Route of Use: oral                  Sleep: Good  Appetite:  Good  Current Medications:  Current Facility-Administered Medications  Medication Dose Route Frequency Provider Last Rate Last Admin   acetaminophen (TYLENOL) tablet 650 mg  650 mg Oral Q6H PRN Revonda Humphrey, NP       acetaminophen (TYLENOL) tablet 650 mg  650 mg Oral Q6H PRN Evette Georges, NP       alum & mag hydroxide-simeth (MAALOX/MYLANTA) 200-200-20 MG/5ML suspension 30 mL  30 mL Oral Q4H PRN Revonda Humphrey, NP       alum & mag hydroxide-simeth (MAALOX/MYLANTA) 200-200-20 MG/5ML suspension 30 mL  30 mL Oral Q4H PRN Evette Georges, NP       chlordiazePOXIDE (LIBRIUM) capsule 25 mg  25 mg Oral Q6H PRN Revonda Humphrey, NP       chlordiazePOXIDE (LIBRIUM) capsule 25 mg  25 mg Oral BH-qamhs Thomes Lolling H, NP   25 mg at 03/30/22 [id number]   Followed by   Derrill Memo ON 03/31/2022] chlordiazePOXIDE (LIBRIUM) capsule 25 mg  25 mg Oral Daily Revonda Humphrey, NP  divalproex (DEPAKOTE) DR tablet 500 mg  500 mg Oral BID Revonda Humphrey, NP   500 mg at 03/30/22 0617   gabapentin (NEURONTIN) capsule 100 mg  100 mg Oral BID Revonda Humphrey, NP   100 mg at 03/30/22 [id number]   hydrOXYzine (ATARAX) tablet 25 mg  25 mg Oral Q6H PRN Revonda Humphrey, NP       loperamide (IMODIUM) capsule 2-4 mg  2-4 mg Oral PRN Revonda Humphrey, NP       magnesium hydroxide (MILK OF MAGNESIA) suspension 30 mL  30 mL Oral Daily PRN Revonda Humphrey, NP       magnesium hydroxide (MILK OF MAGNESIA) suspension 30 mL  30 mL  Oral Daily PRN Evette Georges, NP       multivitamin with minerals tablet 1 tablet  1 tablet Oral Daily Revonda Humphrey, NP   1 tablet at 03/30/22 [id number]   naltrexone (DEPADE) tablet 50 mg  50 mg Oral Daily Rolanda Lundborg, MD   50 mg at 03/30/22 0928   ondansetron (ZOFRAN-ODT) disintegrating tablet 4 mg  4 mg Oral Q6H PRN Revonda Humphrey, NP       sertraline (ZOLOFT) tablet 50 mg  50 mg Oral Daily Rolanda Lundborg, MD   50 mg at 03/30/22 [id number]   traZODone (DESYREL) tablet 50 mg  50 mg Oral QHS PRN Revonda Humphrey, NP   50 mg at 03/29/22 2112   Current Outpatient Medications  Medication Sig Dispense Refill   divalproex (DEPAKOTE) 500 MG DR tablet Take 500 mg by mouth 2 (two) times daily.      Labs  Lab Results:  Admission on 03/27/2022  Component Date Value Ref Range Status   SARS Coronavirus 2 by RT PCR 03/27/2022 NEGATIVE  NEGATIVE Final   Influenza A by PCR 03/27/2022 NEGATIVE  NEGATIVE Final   Influenza B by PCR 03/27/2022 NEGATIVE  NEGATIVE Final   Comment: (NOTE) The Xpert Xpress SARS-CoV-2/FLU/RSV plus assay is intended as an aid in the diagnosis of influenza from Nasopharyngeal swab specimens and should not be used as a sole basis for treatment. Nasal washings and aspirates are unacceptable for Xpert Xpress SARS-CoV-2/FLU/RSV testing.  Fact Sheet for Patients: EntrepreneurPulse.com.au  Fact Sheet for Healthcare Providers: IncredibleEmployment.be  This test is not yet approved or cleared by the [place] FDA and has been authorized for detection and/or diagnosis of SARS-CoV-2 by FDA under an Emergency Use Authorization (EUA). This EUA will remain in effect (meaning this test can be used) for the duration of the COVID-19 declaration under Section 564(b)(1) of the Act, 21 U.S.C. section 360bbb-3(b)(1), unless the authorization is terminated or revoked.     Resp Syncytial Virus by PCR 03/27/2022 NEGATIVE  NEGATIVE Final    Comment: (NOTE) Fact Sheet for Patients: EntrepreneurPulse.com.au  Fact Sheet for Healthcare Providers: IncredibleEmployment.be  This test is not yet approved or cleared by the [place] FDA and has been authorized for detection and/or diagnosis of SARS-CoV-2 by FDA under an Emergency Use Authorization (EUA). This EUA will remain in effect (meaning this test can be used) for the duration of the COVID-19 declaration under Section 564(b)(1) of the Act, 21 U.S.C. section 360bbb-3(b)(1), unless the authorization is terminated or revoked.  Performed at [facility], [place] [address] [place], [place] [address]    WBC 03/27/2022 5.2  4.0 - 10.5 K/uL Final   RBC 03/27/2022 4.38  4.22 - 5.81 MIL/uL Final   Hemoglobin 03/27/2022 15.4  13.0 -  17.0 g/dL Final   HCT 03/27/2022 43.3  39.0 - 52.0 % Final   MCV 03/27/2022 98.9  80.0 - 100.0 fL Final   MCH 03/27/2022 35.2 (H)  26.0 - 34.0 pg Final   MCHC 03/27/2022 35.6  30.0 - 36.0 g/dL Final   RDW 03/27/2022 14.1  11.5 - 15.5 % Final   Platelets 03/27/2022 234  150 - 400 K/uL Final   nRBC 03/27/2022 0.0  0.0 - 0.2 % Final   Neutrophils Relative % 03/27/2022 49  % Final   Neutro Abs 03/27/2022 2.6  1.7 - 7.7 K/uL Final   Lymphocytes Relative 03/27/2022 41  % Final   Lymphs Abs 03/27/2022 2.1  0.7 - 4.0 K/uL Final   Monocytes Relative 03/27/2022 8  % Final   Monocytes Absolute 03/27/2022 0.4  0.1 - 1.0 K/uL Final   Eosinophils Relative 03/27/2022 1  % Final   Eosinophils Absolute 03/27/2022 0.1  0.0 - 0.5 K/uL Final   Basophils Relative 03/27/2022 1  % Final   Basophils Absolute 03/27/2022 0.1  0.0 - 0.1 K/uL Final   Immature Granulocytes 03/27/2022 0  % Final   Abs Immature Granulocytes 03/27/2022 0.01  0.00 - 0.07 K/uL Final   Performed at [facility], [place] [address] [place], [place] [address]   Sodium 03/27/2022 139  135 - 145 mmol/L Final   Potassium 03/27/2022 4.1  3.5 - 5.1 mmol/L  Final   Chloride 03/27/2022 100  98 - 111 mmol/L Final   CO2 03/27/2022 26  22 - 32 mmol/L Final   Glucose, Bld 03/27/2022 79  70 - 99 mg/dL Final   Glucose reference range applies only to samples taken after fasting for at least 8 hours.   BUN 03/27/2022 <5 (L)  6 - 20 mg/dL Final   Creatinine, Ser 03/27/2022 0.85  0.61 - 1.24 mg/dL Final   Calcium 03/27/2022 9.1  8.9 - 10.3 mg/dL Final   Total Protein 03/27/2022 7.3  6.5 - 8.1 g/dL Final   Albumin 03/27/2022 4.1  3.5 - 5.0 g/dL Final   AST 03/27/2022 46 (H)  15 - 41 U/L Final   ALT 03/27/2022 26  0 - 44 U/L Final   Alkaline Phosphatase 03/27/2022 44  38 - 126 U/L Final   Total Bilirubin 03/27/2022 0.5  0.3 - 1.2 mg/dL Final   GFR, Estimated 03/27/2022 >60  >60 mL/min Final   Comment: (NOTE) Calculated using the CKD-EPI Creatinine Equation (2021)    Anion gap 03/27/2022 13  5 - 15 Final   Performed at [place] [address] [place], [place] [address]   Hgb A1c MFr Bld 03/27/2022 5.5  4.8 - 5.6 % Final   Comment: (NOTE) Pre diabetes:          5.7%-6.4%  Diabetes:              >6.4%  Glycemic control for   <7.0% adults with diabetes    Mean Plasma Glucose 03/27/2022 111.15  mg/dL Final   Performed at [facility], [place] [address], [place], [place] [address]   Magnesium 03/27/2022 2.0  1.7 - 2.4 mg/dL Final   Performed at [place] [address], [place], [place] [address]   Alcohol, Ethyl (B) 03/27/2022 209 (H)  <10 mg/dL Final   Comment: (NOTE) Lowest detectable limit for serum alcohol is 10 mg/dL.  For medical purposes only. Performed at [facility], [place],  [place] [address]    RPR Ser Ql 03/27/2022 NON REACTIVE  NON REACTIVE Final   Performed at [facility], [place] [address] [place], [place] [address]   Color, Urine 03/27/2022 STRAW (A)  YELLOW Final   APPearance 03/27/2022 CLEAR  CLEAR Final   Specific Gravity, Urine 03/27/2022 1.004 (L)  1.005 - 1.030 Final    pH 03/27/2022 8.0  5.0 - 8.0 Final   Glucose, UA 03/27/2022 NEGATIVE  NEGATIVE mg/dL Final   Hgb urine dipstick 03/27/2022 NEGATIVE  NEGATIVE Final   Bilirubin Urine 03/27/2022 NEGATIVE  NEGATIVE Final   Ketones, ur 03/27/2022 NEGATIVE  NEGATIVE mg/dL Final   Protein, ur 03/27/2022 NEGATIVE  NEGATIVE mg/dL Final   Nitrite 03/27/2022 NEGATIVE  NEGATIVE Final   Leukocytes,Ua 03/27/2022 NEGATIVE  NEGATIVE Final   RBC / HPF 03/27/2022 0-5  0 - 5 RBC/hpf Final   WBC, UA 03/27/2022 0-5  0 - 5 WBC/hpf Final   Bacteria, UA 03/27/2022 NONE SEEN  NONE SEEN Final   Squamous Epithelial / HPF 03/27/2022 0-5  0 - 5 /HPF Final   Performed at [facility], [place] [address], [place], [place] [address]   POC Amphetamine UR 03/27/2022 None Detected  NONE DETECTED (Cut Off Level 1000 ng/mL) Final   POC Secobarbital (BAR) 03/27/2022 None Detected  NONE DETECTED (Cut Off Level 300 ng/mL) Final   POC Buprenorphine (BUP) 03/27/2022 None Detected  NONE DETECTED (Cut Off Level 10 ng/mL) Final   POC Oxazepam (BZO) 03/27/2022 None Detected  NONE DETECTED (Cut Off Level 300 ng/mL) Final   POC Cocaine UR 03/27/2022 None Detected  NONE DETECTED (Cut Off Level 300 ng/mL) Final   POC Methamphetamine UR 03/27/2022 None Detected  NONE DETECTED (Cut Off Level 1000 ng/mL) Final   POC Morphine 03/27/2022 None Detected  NONE DETECTED (Cut Off Level 300 ng/mL) Final   POC Methadone UR 03/27/2022 None Detected  NONE DETECTED (Cut Off Level 300 ng/mL) Final   POC Oxycodone UR 03/27/2022 None Detected  NONE DETECTED (Cut Off Level 100 ng/mL) Final   POC Marijuana UR 03/27/2022 None Detected  NONE DETECTED (Cut Off Level 50 ng/mL) Final   Valproic Acid Lvl 03/27/2022 59  50.0 - 100.0 ug/mL Final   Performed at [facility], [place] [address], [place], [place] [address]   HIV Screen 4th Generation wRfx 03/27/2022 Non Reactive  Non Reactive Final   Performed at [facility], [place] [address], [place], [place] [address]    Cholesterol 03/27/2022 184  0 - 200 mg/dL Final   Triglycerides 03/27/2022 71  <150 mg/dL Final   HDL 03/27/2022 132  >40 mg/dL Final   Total CHOL/HDL Ratio 03/27/2022 1.4  RATIO Final   VLDL 03/27/2022 14  0 - 40 mg/dL Final   LDL Cholesterol 03/27/2022 NOT CALCULATED  0 - 99 mg/dL Final   Performed at [place] [address] [place], [place] [address]   TSH 03/27/2022 1.427  0.350 - 4.500 uIU/mL Final   Comment: Performed by a 3rd Generation assay with a functional sensitivity of <=0.01 uIU/mL. Performed at [facility], [place] [address], [place], [place] [address]     Blood Alcohol level:  Lab Results  Component Value Date   ETH 209 (H) 03/27/2022   ETH <10 [id number]    Metabolic Disorder Labs: Lab Results  Component Value Date   HGBA1C 5.5 03/27/2022   MPG 111.15 03/27/2022   No results found for: "  PROLACTIN" Lab Results  Component Value Date   CHOL 184 03/27/2022   TRIG 71 03/27/2022   HDL 132 03/27/2022   CHOLHDL 1.4 03/27/2022   VLDL 14 03/27/2022   LDLCALC NOT CALCULATED 03/27/2022    Therapeutic Lab Levels: No results found for: "LITHIUM" Lab Results  Component Value Date   VALPROATE 59 03/27/2022   No results found for: "CBMZ"  Physical Findings   PHQ2-9    Flowsheet Row ED from 03/27/2022 in [facility]  PHQ-2 Total Score 2      Flowsheet Row ED from 03/27/2022 in [facility] No Risk        Musculoskeletal  Strength & Muscle Tone: within normal limits Gait & Station: normal Patient leans: N/A  Mental Status Exam   Appearance and Grooming: Patient is casually dressed and lying in bed. The patient has no noticeable scent or odor. Motor activity: The patient's movement speed was not observed; his gait was not observed during encounter. There was no notable abnormal facial movements and no notable abnormal extremity movements. Behavior: The patient  appears in no acute distress, and during the interview, was anxious, required frequent redirection and behaving appropriately to scenario; he was able to follow commands and compliant to requests and made good eye contact. The patient did not appear internally or externally preoccupied. Attitude: Patient's attitude towards the interviewer was cooperative and open. Speech: The patient's speech was clear, fluent, with good articulation, and with appropriately placed inflections. The volume of his speech was normal and normal in quantity. The rate was normal with a normal rhythm. Responses were normal in latency. There were no abnormal patterns in speech. Mood: "Stubborn" Affect: Patient's affect is blunted with broad range and even fluctuations; his affect is appropriate for the topic of conversation with his stated mood.  ------------------------------------------------------------------------------------------------------------------------- Thought Content The patient experiences no hallucinations. The patient describes no delusional thoughts; he denies thought insertion, denies thought withdrawal, denies thought interruption, and denies thought broadcasting. Patient at the time of interview denies active suicidal intent and denies passive suicidal ideation; he denies homicidal intent. Thought Process The patient's thought process is goal-directed and linear.  Insight The patient at the time of interview demonstrates fair insight as evidenced by willingness to stay after conversation re discharge. Judgement The patient over the past 24 hours demonstrates fair judgement  Memory: limited  Executive Functions  Concentration: intact Attention Span: fair Recall: intact Fund of Knowledge: fair  Alertness/Orientation: alert and answering questions appropriately   Physical Exam  Constitutional:      Appearance: the patient is not toxic-appearing.  Pulmonary:     Effort: Pulmonary effort  is normal.  Neurological:     General: No focal deficit present.     Mental Status: the patient is alert and answering questions appropriately.   Review of Systems  Respiratory:  Negative for shortness of breath.   Cardiovascular:  Negative for chest pain.  Gastrointestinal:  Negative for abdominal pain, constipation, diarrhea, nausea and vomiting.  Neurological:  Negative for headaches.   Blood pressure 100/62, pulse 60, temperature 98.9 F (37.2 C), temperature source Tympanic, resp. rate 16, SpO2 97 %. There is no height or weight on file to calculate BMI.  Treatment Plan Summary: Daily contact with patient to assess and evaluate symptoms and progress in treatment, Medication management, and Plan    ZEKI GEIS is a 37 yo male w/ hx of MDD, alcohol use disorder,  seizures admitted to [facility] for alcohol detox. Started on librium for his alcohol use disorder. He is currently day 3/4 and tolerating taper. He appears to have minimal symptoms of alcohol withdrawal at this time. Most recent CIWAs 0,0. Discussed advice to stay to complete his librium taper and discharge tomorrow. At time of interview, he was willing to stay. He has also been on depakote for his prior history of seizures. He has not followed up with a neurologist so will place ambulatory referral to neurology for his seizure history since it is unclear if it is always in the context of alcohol withdrawal. We will also start naltrexone for his alcohol use disorder. Will also restart his home zoloft for his MDD.    AST 46  Indirect bili 2.0 Alcohol level 209  Depakote level 59 A1c 5.5  UDS negative   Alcohol Use Disorder Hx of Seizures -Monitor for withdrawal, most recent CIWA 0, 0 -Continue librium taper. Day 3/4. (complete on 2/10) -Continue gabapentin 100 mg bid for alcohol cravings -Continue depakote DR 500 mg bid for seizures -Continue naltrexone 29m for alcohol use disorder  -will place ambulatory referral to  neurology at discharge   MDD -Continue home zoloft 569mfor MDD   Dispo: home 2/10 after completion of alcohol detox   StRolanda LundborgMD, PGY-1  03/30/2022 10:54 AM

## 2022-03-30 NOTE — ED Notes (Signed)
Patient resting quietly in bed with eyes closed, Respirations equal and unlabored, skin warm and dry, NAD. Routine safety checks conducted according to facility protocol. Will continue to monitor for safety.

## 2022-03-30 NOTE — ED Notes (Signed)
Patient awake and alert this morning.  He ate breakfast and watched tv.  Patient now returned to his room and is resting.  Patient without distress or complaint.  No withdrawal noted.  Calm and cooperative however affect constricted.  Mood flat.  Will monitor and provide a safe environment.

## 2022-03-30 NOTE — ED Notes (Signed)
Pt requesting more medication to help him sleep. Leandro Reasoner, NP notified.

## 2022-03-30 NOTE — ED Notes (Signed)
Pt asleep in bed. Respirations even and unlabored. Monitoring for safety. 

## 2022-03-30 NOTE — ED Notes (Signed)
Pt sitting in dining room watching TV. A&O x4, calm and cooperative. Denies current SI/HI/AVH. No signs of distress noted. Monitoring for safety.

## 2022-03-31 DIAGNOSIS — F419 Anxiety disorder, unspecified: Secondary | ICD-10-CM | POA: Diagnosis not present

## 2022-03-31 DIAGNOSIS — F329 Major depressive disorder, single episode, unspecified: Secondary | ICD-10-CM | POA: Diagnosis not present

## 2022-03-31 DIAGNOSIS — F1010 Alcohol abuse, uncomplicated: Secondary | ICD-10-CM | POA: Diagnosis not present

## 2022-03-31 DIAGNOSIS — Z1152 Encounter for screening for COVID-19: Secondary | ICD-10-CM | POA: Diagnosis not present

## 2022-03-31 MED ORDER — SERTRALINE HCL 50 MG PO TABS: 50.0000 mg | ORAL_TABLET | Freq: Every day | ORAL | 0 refills | Status: DC

## 2022-03-31 MED ORDER — GABAPENTIN 100 MG PO CAPS: 100.0000 mg | ORAL_CAPSULE | Freq: Two times a day (BID) | ORAL | 0 refills | Status: DC

## 2022-03-31 MED ORDER — DIVALPROEX SODIUM 500 MG PO DR TAB: 500.0000 mg | DELAYED_RELEASE_TABLET | Freq: Two times a day (BID) | ORAL | 0 refills | Status: DC

## 2022-03-31 MED ORDER — NALTREXONE HCL 50 MG PO TABS: 50.0000 mg | ORAL_TABLET | Freq: Every day | ORAL | 0 refills | Status: AC

## 2022-03-31 NOTE — ED Provider Notes (Addendum)
FBC/OBS ASAP Discharge Summary  Date and Time: 03/31/2022 9:44 AM  Name: Peter Waller  MRN:  [id number]   Discharge Diagnoses:  Final diagnoses:  Alcohol abuse  Alcohol use disorder  Seizures ([place])  Major depressive disorder with current active episode, unspecified depression episode severity, unspecified whether recurrent    Subjective:  Peter Waller is a 37 yo male w/ hx of MDD, alcohol use disorder (1-2 24 packs of beer per day for past 6 months), seizures admitted to [facility] for alcohol detox.    He reports that he is doing good today.  He reports that his withdrawal symptoms have completely resolved except for mild shakes.  He reports no cravings.  He reports that he plans to start going to [employer] and he was provided with a list of AA meetings in the [place] area as well as discussed virtual [place] meetings which would greatly expand his options.  Discussed that a referral to neurology had been placed.  Discussed that prescriptions for medications would be provided.  He reports no side effects to his medications.  Discussed if he would like a prescription for nicotine patches or nicotine gum and he declined.  Discussed with him at if he changes his mind he can call 1 800 quit now for counseling and free resources.  He reports no SI, HI, or AVH.  He reports sleep is fair.  He reports his appetite is good.  He reports no other concerns at present.  Stay Summary:  He presented to [facility] on 2/6 for Detox after being denied from [facility] due to elevated BP.  He was was admitted to [facility] and started on a Librium taper which he tolerated well.  Given his unclear history of Seizures a referral to Neurology was placed so that he could follow up once discharged.  He completed the Librium taper on 2/10 and was discharged home and his wife picked him up.  Total Time spent with patient: 20 minutes  Past Psychiatric History: MDD, alcohol use disorder, tobacco use disorder Past Medical  History: history of seizures  Family History: unknown Family Psychiatric  History: unknown Social History: lives with wife and 5 children. Patient lost his job 3 months ago after relapse on alcohol (3 years of sobriety)   Tobacco Cessation:  A prescription for an FDA-approved tobacco cessation medication was offered at discharge and the patient refused  Current Medications:  Current Facility-Administered Medications  Medication Dose Route Frequency Provider Last Rate Last Admin   acetaminophen (TYLENOL) tablet 650 mg  650 mg Oral Q6H PRN Revonda Humphrey, NP       acetaminophen (TYLENOL) tablet 650 mg  650 mg Oral Q6H PRN Evette Georges, NP       alum & mag hydroxide-simeth (MAALOX/MYLANTA) 200-200-20 MG/5ML suspension 30 mL  30 mL Oral Q4H PRN Revonda Humphrey, NP       alum & mag hydroxide-simeth (MAALOX/MYLANTA) 200-200-20 MG/5ML suspension 30 mL  30 mL Oral Q4H PRN Evette Georges, NP       divalproex (DEPAKOTE) DR tablet 500 mg  500 mg Oral BID Revonda Humphrey, NP   500 mg at 03/31/22 0555   gabapentin (NEURONTIN) capsule 100 mg  100 mg Oral BID Revonda Humphrey, NP   100 mg at 03/31/22 [id number]   magnesium hydroxide (MILK OF MAGNESIA) suspension 30 mL  30 mL Oral Daily PRN Revonda Humphrey, NP       magnesium hydroxide (MILK OF MAGNESIA) suspension  30 mL  30 mL Oral Daily PRN Evette Georges, NP       multivitamin with minerals tablet 1 tablet  1 tablet Oral Daily Revonda Humphrey, NP   1 tablet at 03/31/22 [id number]   naltrexone (DEPADE) tablet 50 mg  50 mg Oral Daily Rolanda Lundborg, MD   50 mg at 03/31/22 [id number]   sertraline (ZOLOFT) tablet 50 mg  50 mg Oral Daily Rolanda Lundborg, MD   50 mg at 03/31/22 [id number]   traZODone (DESYREL) tablet 50 mg  50 mg Oral QHS PRN Revonda Humphrey, NP   50 mg at 03/30/22 2132   traZODone (DESYREL) tablet 50 mg  50 mg Oral Once Bobbitt, Shalon E, NP       Current Outpatient Medications  Medication Sig Dispense Refill   divalproex (DEPAKOTE) 500 MG DR  tablet Take 1 tablet (500 mg total) by mouth 2 (two) times daily. 60 tablet 0   gabapentin (NEURONTIN) 100 MG capsule Take 1 capsule (100 mg total) by mouth 2 (two) times daily. 60 capsule 0   naltrexone (DEPADE) 50 MG tablet Take 1 tablet (50 mg total) by mouth daily. 30 tablet 0   sertraline (ZOLOFT) 50 MG tablet Take 1 tablet (50 mg total) by mouth daily. 30 tablet 0    PTA Medications:  Facility Ordered Medications  Medication   acetaminophen (TYLENOL) tablet 650 mg   alum & mag hydroxide-simeth (MAALOX/MYLANTA) 200-200-20 MG/5ML suspension 30 mL   magnesium hydroxide (MILK OF MAGNESIA) suspension 30 mL   traZODone (DESYREL) tablet 50 mg   [COMPLETED] thiamine (VITAMIN B1) injection 100 mg   multivitamin with minerals tablet 1 tablet   [EXPIRED] chlordiazePOXIDE (LIBRIUM) capsule 25 mg   [EXPIRED] hydrOXYzine (ATARAX) tablet 25 mg   [EXPIRED] loperamide (IMODIUM) capsule 2-4 mg   [EXPIRED] ondansetron (ZOFRAN-ODT) disintegrating tablet 4 mg   [COMPLETED] chlordiazePOXIDE (LIBRIUM) capsule 25 mg   Followed by   [COMPLETED] chlordiazePOXIDE (LIBRIUM) capsule 25 mg   Followed by   [COMPLETED] chlordiazePOXIDE (LIBRIUM) capsule 25 mg   Followed by   [COMPLETED] chlordiazePOXIDE (LIBRIUM) capsule 25 mg   gabapentin (NEURONTIN) capsule 100 mg   divalproex (DEPAKOTE) DR tablet 500 mg   acetaminophen (TYLENOL) tablet 650 mg   alum & mag hydroxide-simeth (MAALOX/MYLANTA) 200-200-20 MG/5ML suspension 30 mL   magnesium hydroxide (MILK OF MAGNESIA) suspension 30 mL   naltrexone (DEPADE) tablet 50 mg   sertraline (ZOLOFT) tablet 50 mg   traZODone (DESYREL) tablet 50 mg   PTA Medications  Medication Sig   sertraline (ZOLOFT) 50 MG tablet Take 1 tablet (50 mg total) by mouth daily.   naltrexone (DEPADE) 50 MG tablet Take 1 tablet (50 mg total) by mouth daily.   divalproex (DEPAKOTE) 500 MG DR tablet Take 1 tablet (500 mg total) by mouth 2 (two) times daily.   gabapentin (NEURONTIN) 100  MG capsule Take 1 capsule (100 mg total) by mouth 2 (two) times daily.       03/31/2022    9:34 AM 03/30/2022    2:27 PM 03/28/2022    3:24 PM  Depression screen PHQ 2/9  Decreased Interest 0 0 1  Down, Depressed, Hopeless 0 0 1  PHQ - 2 Score 0 0 2  Altered sleeping 0 0   Tired, decreased energy 0 0   Change in appetite 0 0   Feeling bad or failure about yourself  0 1   Trouble concentrating 0 0   Moving slowly or fidgety/restless 0 0  Suicidal thoughts 0 0   PHQ-9 Score 0 1   Difficult doing work/chores Not difficult at all Somewhat difficult     Flowsheet Row ED from 03/27/2022 in [facility] No Risk       Musculoskeletal  Strength & Muscle Tone: within normal limits Gait & Station: normal Patient leans: N/A  Psychiatric Specialty Exam  Presentation  General Appearance:  Appropriate for Environment; Casual  Eye Contact: Good  Speech: Clear and Coherent; Normal Rate  Speech Volume: Normal  Handedness: Right   Mood and Affect  Mood: -- ("ok")  Affect: Appropriate; Congruent   Thought Process  Thought Processes: Coherent; Goal Directed  Descriptions of Associations:Intact  Orientation:Full (Time, Place and Person)  Thought Content:Logical; WDL  Diagnosis of Schizophrenia or Schizoaffective disorder in past: No    Hallucinations:Hallucinations: None  Ideas of Reference:None  Suicidal Thoughts:Suicidal Thoughts: No  Homicidal Thoughts:Homicidal Thoughts: No   Sensorium  Memory: Immediate Good; Recent Good  Judgment: Good  Insight: Good   Executive Functions  Concentration: Good  Attention Span: Good  Recall: Good  Fund of Knowledge: Good  Language: Good   Psychomotor Activity  Psychomotor Activity:Psychomotor Activity: Normal   Assets  Assets: Communication Skills; Desire for Improvement; Financial Resources/Insurance; Resilience; Physical Health; Social  Support; Housing   Sleep  Sleep:Sleep: Fair   No data recorded  Physical Exam  Physical Exam Vitals and nursing note reviewed.  Constitutional:      General: He is not in acute distress.    Appearance: Normal appearance. He is normal weight. He is not ill-appearing or toxic-appearing.  HENT:     Head: Normocephalic and atraumatic.  Pulmonary:     Effort: Pulmonary effort is normal.  Musculoskeletal:        General: Normal range of motion.  Neurological:     General: No focal deficit present.     Mental Status: He is alert.    Review of Systems  Respiratory:  Negative for cough and shortness of breath.   Cardiovascular:  Negative for chest pain.  Gastrointestinal:  Negative for abdominal pain, constipation, diarrhea, nausea and vomiting.  Neurological:  Positive for tremors (mild). Negative for dizziness, weakness and headaches.  Psychiatric/Behavioral:  Negative for depression, hallucinations and suicidal ideas. The patient is not nervous/anxious.    Blood pressure 133/72, pulse 62, temperature 98.7 F (37.1 C), temperature source Tympanic, resp. rate 18, SpO2 100 %. There is no height or weight on file to calculate BMI.  Demographic Factors:  Male, Caucasian, and Unemployed  Loss Factors: Decrease in vocational status and Financial problems/change in socioeconomic status  Historical Factors: NA  Risk Reduction Factors:   Responsible for children under 68 years of age, Living with another person, especially a relative, Positive therapeutic relationship, and Positive coping skills or problem solving skills  Continued Clinical Symptoms:  Previous Psychiatric Diagnoses and Treatments  Cognitive Features That Contribute To Risk:  None    Suicide Risk:  Minimal: No identifiable suicidal ideation.  Patients presenting with no risk factors but with morbid ruminations; may be classified as minimal risk based on the severity of the depressive symptoms  Plan Of  Care/Follow-up recommendations/Disposition: Activity: as tolerated  Diet: heart healthy  Other: -Follow-up with your outpatient psychiatric provider -instructions on appointment date, time, and address (location) are provided to you in discharge paperwork.  -Take your psychiatric medications as prescribed at discharge - instructions are provided to you in the discharge paperwork  -Follow-up  with outpatient primary care doctor and other specialists -for management of chronic medical disease, including: Maintaining abstinence from alcohol  -Testing: Follow-up with outpatient provider for abnormal lab results: None  -Recommend abstinence from alcohol, tobacco, and other illicit drug use at discharge.   -If your psychiatric symptoms recur, worsen, or if you have side effects to your psychiatric medications, call your outpatient psychiatric provider, 911, [phone] or go to the nearest emergency department.  -If suicidal thoughts recur, call your outpatient psychiatric provider, 911, [phone] or go to the nearest emergency department.   Briant Cedar, MD 03/31/2022, 9:44 AM

## 2022-03-31 NOTE — ED Notes (Signed)
Pt asleep in bed. Respirations even and unlabored. Monitoring for safety. 

## 2022-04-06 ENCOUNTER — Encounter: Payer: Self-pay | Admitting: Neurology

## 2022-04-06 ENCOUNTER — Ambulatory Visit: Payer: Medicaid Other | Admitting: Neurology

## 2023-07-22 ENCOUNTER — Ambulatory Visit (INDEPENDENT_AMBULATORY_CARE_PROVIDER_SITE_OTHER): Payer: MEDICAID | Admitting: Student

## 2023-07-22 ENCOUNTER — Encounter (HOSPITAL_BASED_OUTPATIENT_CLINIC_OR_DEPARTMENT_OTHER): Payer: Self-pay

## 2023-07-22 ENCOUNTER — Encounter (HOSPITAL_BASED_OUTPATIENT_CLINIC_OR_DEPARTMENT_OTHER): Payer: Self-pay | Admitting: Student

## 2023-07-22 VITALS — BP 112/74 | HR 66 | Temp 97.9°F | Resp 16 | Ht 69.49 in | Wt 215.0 lb

## 2023-07-22 DIAGNOSIS — R569 Unspecified convulsions: Secondary | ICD-10-CM

## 2023-07-22 DIAGNOSIS — Z5181 Encounter for therapeutic drug level monitoring: Secondary | ICD-10-CM

## 2023-07-22 DIAGNOSIS — R251 Tremor, unspecified: Secondary | ICD-10-CM

## 2023-07-22 DIAGNOSIS — F321 Major depressive disorder, single episode, moderate: Secondary | ICD-10-CM

## 2023-07-22 DIAGNOSIS — Z131 Encounter for screening for diabetes mellitus: Secondary | ICD-10-CM

## 2023-07-22 DIAGNOSIS — F1090 Alcohol use, unspecified, uncomplicated: Secondary | ICD-10-CM

## 2023-07-22 DIAGNOSIS — Z136 Encounter for screening for cardiovascular disorders: Secondary | ICD-10-CM

## 2023-07-22 DIAGNOSIS — Z7689 Persons encountering health services in other specified circumstances: Secondary | ICD-10-CM

## 2023-07-22 DIAGNOSIS — Z13220 Encounter for screening for lipoid disorders: Secondary | ICD-10-CM

## 2023-07-22 MED ORDER — CLONIDINE HCL 0.1 MG PO TABS: 0.1000 mg | ORAL_TABLET | Freq: Two times a day (BID) | ORAL | 6 refills | Status: AC

## 2023-07-22 MED ORDER — DIVALPROEX SODIUM ER 500 MG PO TB24: 500.0000 mg | ORAL_TABLET | Freq: Two times a day (BID) | ORAL | 1 refills | Status: DC

## 2023-07-22 NOTE — Patient Instructions (Addendum)
 It was nice to see you today!  As we discussed in clinic:  Please let me know when you are able to get in with neurology.  If you have any problems before your next visit feel free to message me via MyChart (minor issues or questions) or call the office, otherwise you may reach out to schedule an office visit.  Thank you! Christopher Glasscock, PA-C

## 2023-07-22 NOTE — Assessment & Plan Note (Signed)
 Chronic, stable.  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 Managed with clonidine  and hydroxyzine  for symptomatic relief. Effective despite some medical opinions against this combination. - Continue clonidine . - Continue hydroxyzine  as needed.

## 2023-07-22 NOTE — Assessment & Plan Note (Signed)
 Chronic, stable. Alcohol  use disorder with last drink in October 2024. High risk for seizures related to alcohol  use. Prefers spiritual support over AA meetings for sobriety. Committed to abstinence. - Encourage continued abstinence. - Support spiritual practices for sobriety.

## 2023-07-22 NOTE — Progress Notes (Signed)
 New Patient Office Visit  Subjective    Patient ID: Peter Waller, male    DOB: [birth year]  Age: 38 y.o. MRN: [id number]  CC:  Chief Complaint  Patient presents with   Establish Care    Here to establish care.    Seizures    Was seeing [facility], recovering alcoholic. Started having seizures. Was put on meds and following up on them. Was told he was doing good and can now see PCP. Did clonidine , Depakote , sertraline , and trazodone . Still has tremors but clonidine  helps a lot. 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     Discussed the use of AI scribe software for clinical note transcription with the patient, who gave verbal consent to proceed.  History of Present Illness   Peter Waller is a 38 year old male with alcohol -related seizures who presents to establish care and discuss medication management. He is accompanied by his wife.  He has a history of supposed alcohol -related seizures and has been on Depakote  for seizure management. He has been alcohol -free since November 22, 2022, and has not had a seizure in approximately one to two years. He was initially switched from gabapentin  to Depakote  after experiencing a seizure while on gabapentin . He is concerned about running out of Depakote  and the risk of seizures if he does not take it. After record review, it appears that there was some concern in regards to whether seizures were totally alcohol  related or not.  He is currently taking clonidine  and hydroxyzine , which help manage his symptoms. He has been stable on this regimen for about four years.  He takes Zoloft  50 mg for anxiety and depression and trazodone  100 mg for sleep. His anxiety and depression are managed with the current medication regimen.  He has a history of alcohol  use disorder and maintains sobriety by staying busy and engaging in spiritual activities rather than attending AA meetings. He finds listening to sermons and reading Bible verses more beneficial  for his recovery.  He vapes nicotine  after quitting smoking a year ago. No history of diabetes, high cholesterol, or IV drug use.  There is a family history of seizures, as his aunt began experiencing them in her thirties, similar to his own experience. He recalls his first seizure occurring about eight years ago.      Screenings:  Colon Cancer: no fmh Lung Cancer: vapes nicotine  Diabetes: no pmh HLD: no pmh   Outpatient Encounter Medications as of 07/22/2023  Medication Sig   hydrOXYzine  (ATARAX ) 25 MG tablet Take 25 mg by mouth as needed for anxiety.   naltrexone  (DEPADE) 50 MG tablet Take 1 tablet (50 mg total) by mouth daily.   sertraline  (ZOLOFT ) 50 MG tablet Take 50 mg by mouth daily.   traZODone  (DESYREL ) 100 MG tablet Take 100 mg by mouth at bedtime as needed.   [DISCONTINUED] cloNIDine  (CATAPRES ) 0.1 MG tablet Take 0.1 mg by mouth 2 (two) times daily.   [DISCONTINUED] divalproex  (DEPAKOTE  ER) 500 MG 24 hr tablet Take 500 mg by mouth in the morning and at bedtime.   [DISCONTINUED] gabapentin  (NEURONTIN ) 100 MG capsule Take 1 capsule (100 mg total) by mouth 2 (two) times daily.   [DISCONTINUED] sertraline  (ZOLOFT ) 50 MG tablet Take 1 tablet (50 mg total) by mouth daily.   cloNIDine  (CATAPRES ) 0.1 MG tablet Take 1 tablet (0.1 mg total) by mouth 2 (two) times daily.   divalproex  (DEPAKOTE  ER) 500 MG 24 hr tablet Take 1 tablet (500  mg total) by mouth in the morning and at bedtime.   [DISCONTINUED] divalproex  (DEPAKOTE ) 500 MG DR tablet Take 1 tablet (500 mg total) by mouth 2 (two) times daily. (Patient not taking: Reported on 07/22/2023)   [DISCONTINUED] sertraline  (ZOLOFT ) 50 MG tablet Take 75 mg by mouth at bedtime. (Patient not taking: Reported on 07/22/2023)   No facility-administered encounter medications on file as of 07/22/2023.    Past Medical History:  Diagnosis Date   Alcohol  abuse    Seizures ([facility])    DTs    Past Surgical History:  Procedure Laterality Date    APPENDECTOMY     EAR TUBE REMOVAL     Left ear growth in inner ear x6     TYMPANOSTOMY TUBE PLACEMENT      Family History  Problem Relation Age of Onset   Depression Mother    COPD Mother    Depression Father     Social History   Socioeconomic History   Marital status: Married    Spouse name: Not on file   Number of children: 5   Years of education: Not on file   Highest education level: Not on file  Occupational History    Employer: [employer]  Tobacco Use   Smoking status: Former    Current packs/day: 0.00    Average packs/day: 0.5 packs/day for 29.0 years (14.5 ttl pk-yrs)    Types: Cigarettes    Start date: 5    Quit date: 2024    Years since quitting: 1.4    Passive exposure: Current   Smokeless tobacco: Former  Building services engineer status: Every Day   Substances: Nicotine   Substance and Sexual Activity   Alcohol  use: Not Currently    Comment: FORMER   Drug use: No    Comment: Admits using 2 days ago--"I am not addicted'   Sexual activity: Yes  Other Topics Concern   Not on file  Social History Narrative   1 child & 4 step kids   Social Drivers of Corporate investment banker Strain: Not on file  Food Insecurity: No Food Insecurity (07/22/2023)   Hunger Vital Sign    Worried About Running Out of Food in the Last Year: Never true    Ran Out of Food in the Last Year: Never true  Transportation Needs: No Transportation Needs (07/22/2023)   PRAPARE - Administrator, Civil Service (Medical): No    Lack of Transportation (Non-Medical): No  Physical Activity: Not on file  Stress: Not on file  Social Connections: Not on file  Intimate Partner Violence: Not At Risk (07/22/2023)   Humiliation, Afraid, Rape, and Kick questionnaire    Fear of Current or Ex-Partner: No    Emotionally Abused: No    Physically Abused: No    Sexually Abused: No    ROS  Per HPI      Objective    BP 112/74   Pulse 66   Temp 97.9 F (36.6 C) (Oral)   Resp  16   Ht 5' 9.49" (1.765 m)   Wt 215 lb (97.5 kg)   SpO2 95%   BMI 31.31 kg/m   Physical Exam Constitutional:      General: He is not in acute distress.    Appearance: Normal appearance. He is not ill-appearing.  HENT:     Head: Normocephalic and atraumatic.     Nose: Nose normal.     Mouth/Throat:     Mouth: Mucous membranes  are moist.     Pharynx: Oropharynx is clear.  Eyes:     General: No scleral icterus.    Extraocular Movements: Extraocular movements intact.     Conjunctiva/sclera: Conjunctivae normal.     Pupils: Pupils are equal, round, and reactive to light.  Neck:     Vascular: No carotid bruit.  Cardiovascular:     Rate and Rhythm: Normal rate and regular rhythm.     Pulses: Normal pulses.     Heart sounds: Normal heart sounds. No murmur heard.    No friction rub.  Pulmonary:     Effort: Pulmonary effort is normal. No respiratory distress.     Breath sounds: Normal breath sounds. No wheezing, rhonchi or rales.  Musculoskeletal:        General: Normal range of motion.     Cervical back: Neck supple.     Right lower leg: No edema.     Left lower leg: No edema.  Skin:    General: Skin is warm and dry.     Coloration: Skin is not jaundiced or pale.  Neurological:     General: No focal deficit present.     Mental Status: He is alert.  Psychiatric:        Mood and Affect: Mood normal.        Behavior: Behavior normal.         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 Record review revealed that there is concern for seizures outside of alcohol  use. Multiple seizures with last episode 1-2 years ago. High risk due to alcohol  use disorder and frequent hospital visits. Managed with Depakote . - Contact [facility] for records - Bridge with Depakote  until neurology appointment. - Order Depakote  level to ensure therapeutic range. - Refer to neurology for long-term  management.  Orders: -     Divalproex  Sodium ER; Take 1 tablet (500 mg total) by mouth in the morning and at bedtime.  Dispense: 60 tablet; Refill: 1 -     Ambulatory referral to Neurology -     CBC with Differential/Platelet -     Comprehensive metabolic panel with GFR  Alcohol  use disorder Assessment & Plan: Chronic, stable. Alcohol  use disorder with last drink in October 2024. High risk for seizures related to alcohol  use. Prefers spiritual support over AA meetings for sobriety. Committed to abstinence. - Encourage continued abstinence. - Support spiritual practices for sobriety.  Orders: -     cloNIDine  HCl; Take 1 tablet (0.1 mg total) by mouth 2 (two) times daily.  Dispense: 60 tablet; Refill: 6 -     Divalproex  Sodium ER; Take 1 tablet (500 mg total) by mouth in the morning and at bedtime.  Dispense: 60 tablet; Refill: 1  Current moderate episode of major depressive disorder without prior episode [facility]) Assessment & Plan: Chronic, stable. Depression and anxiety managed with Zoloft  50 mg. Trazodone  100 mg used for sleep. Regimen well-managed for four years. - Continue Zoloft  50 mg. - Continue Trazodone  100 mg for sleep.   Orders: -     Comprehensive metabolic panel with GFR -     Valproic acid  level  Therapeutic drug monitoring -     Valproic acid  level  Encounter for lipid screening for cardiovascular disease -     Lipid panel  Screening for diabetes mellitus -     Hemoglobin A1c  Tremor Assessment & Plan: Chronic, stable.  Peripheral  nerve damage secondary to alcohol  use. Managed with clonidine  and hydroxyzine  for symptomatic relief. Effective despite some medical opinions against this combination. - Continue clonidine . - Continue hydroxyzine  as needed.  Orders: -     cloNIDine  HCl; Take 1 tablet (0.1 mg total) by mouth 2 (two) times daily.  Dispense: 60 tablet; Refill: 6   Return in about 6 weeks (around 09/02/2023) for Chronic Followup.   Santos Hardwick T Sony Schlarb,  PA-C

## 2023-07-22 NOTE — Assessment & Plan Note (Signed)
 Chronic, stable. Depression and anxiety managed with Zoloft  50 mg. Trazodone  100 mg used for sleep. Regimen well-managed for four years. - Continue Zoloft  50 mg. - Continue Trazodone  100 mg for sleep.

## 2023-07-22 NOTE — Assessment & Plan Note (Addendum)
 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 Record review revealed that there is concern for seizures outside of alcohol  use. Multiple seizures with last episode 1-2 years ago. High risk due to alcohol  use disorder and frequent hospital visits. Managed with Depakote . - Contact [facility] for records - Bridge with Depakote  until neurology appointment. - Order Depakote  level to ensure therapeutic range. - Refer to neurology for long-term management.

## 2023-07-23 LAB — CBC WITH DIFFERENTIAL/PLATELET
Basophils Absolute: 0.1 10*3/uL (ref 0.0–0.2)
Basos: 1 %
EOS (ABSOLUTE): 0.2 10*3/uL (ref 0.0–0.4)
Eos: 2 %
Hematocrit: 41.2 % (ref 37.5–51.0)
Hemoglobin: 14 g/dL (ref 13.0–17.7)
Immature Grans (Abs): 0 10*3/uL (ref 0.0–0.1)
Immature Granulocytes: 0 %
Lymphocytes Absolute: 3.6 10*3/uL — ABNORMAL HIGH (ref 0.7–3.1)
Lymphs: 45 %
MCH: 34.1 pg — ABNORMAL HIGH (ref 26.6–33.0)
MCHC: 34 g/dL (ref 31.5–35.7)
MCV: 101 fL — ABNORMAL HIGH (ref 79–97)
Monocytes Absolute: 0.7 10*3/uL (ref 0.1–0.9)
Monocytes: 9 %
Neutrophils Absolute: 3.4 10*3/uL (ref 1.4–7.0)
Neutrophils: 43 %
Platelets: 175 10*3/uL (ref 150–450)
RBC: 4.1 x10E6/uL — ABNORMAL LOW (ref 4.14–5.80)
RDW: 13 % (ref 11.6–15.4)
WBC: 7.9 10*3/uL (ref 3.4–10.8)

## 2023-07-23 LAB — LIPID PANEL
Chol/HDL Ratio: 3.3 ratio (ref 0.0–5.0)
Cholesterol, Total: 116 mg/dL (ref 100–199)
HDL: 35 mg/dL — ABNORMAL LOW (ref >39)
LDL Chol Calc (NIH): 34 mg/dL (ref 0–99)
Triglycerides: 322 mg/dL — ABNORMAL HIGH (ref 0–149)
VLDL Cholesterol Cal: 47 mg/dL — ABNORMAL HIGH (ref 5–40)

## 2023-07-23 LAB — COMPREHENSIVE METABOLIC PANEL WITH GFR
ALT: 20 IU/L (ref 0–44)
AST: 18 IU/L (ref 0–40)
Albumin: 4.6 g/dL (ref 4.1–5.1)
Alkaline Phosphatase: 45 IU/L (ref 44–121)
BUN/Creatinine Ratio: 10 (ref 9–20)
BUN: 11 mg/dL (ref 6–20)
Bilirubin Total: 0.4 mg/dL (ref 0.0–1.2)
CO2: 23 mmol/L (ref 20–29)
Calcium: 9.5 mg/dL (ref 8.7–10.2)
Chloride: 100 mmol/L (ref 96–106)
Creatinine, Ser: 1.13 mg/dL (ref 0.76–1.27)
Globulin, Total: 2.3 g/dL (ref 1.5–4.5)
Glucose: 87 mg/dL (ref 70–99)
Potassium: 4.5 mmol/L (ref 3.5–5.2)
Sodium: 141 mmol/L (ref 134–144)
Total Protein: 6.9 g/dL (ref 6.0–8.5)
eGFR: 85 mL/min/{1.73_m2} (ref >59)

## 2023-07-23 LAB — HEMOGLOBIN A1C
Est. average glucose Bld gHb Est-mCnc: 120 mg/dL
Hgb A1c MFr Bld: 5.8 % — ABNORMAL HIGH (ref 4.8–5.6)

## 2023-07-23 LAB — VALPROIC ACID LEVEL: Valproic Acid Lvl: 45 ug/mL — ABNORMAL LOW (ref 50–100)

## 2023-07-31 ENCOUNTER — Ambulatory Visit (HOSPITAL_BASED_OUTPATIENT_CLINIC_OR_DEPARTMENT_OTHER): Payer: Self-pay | Admitting: Student

## 2023-07-31 DIAGNOSIS — E781 Pure hyperglyceridemia: Secondary | ICD-10-CM

## 2023-07-31 DIAGNOSIS — Z5181 Encounter for therapeutic drug level monitoring: Secondary | ICD-10-CM

## 2023-07-31 DIAGNOSIS — D539 Nutritional anemia, unspecified: Secondary | ICD-10-CM

## 2023-07-31 MED ORDER — FENOFIBRATE 145 MG PO TABS: 145.0000 mg | ORAL_TABLET | Freq: Every day | ORAL | 3 refills | Status: AC

## 2023-07-31 NOTE — Progress Notes (Signed)
 Called and talked with patient's wife about labs. Planning to send in tricor for trigs. Rechecking depakote  level as it was slightly low- no breakthrough seizures noted. Checking b12 level.  They have heard from neurology, they need to call them back and make an appointment- encouraged to do so.

## 2023-09-03 ENCOUNTER — Ambulatory Visit (HOSPITAL_BASED_OUTPATIENT_CLINIC_OR_DEPARTMENT_OTHER): Payer: MEDICAID | Admitting: Student

## 2023-09-16 ENCOUNTER — Other Ambulatory Visit (HOSPITAL_BASED_OUTPATIENT_CLINIC_OR_DEPARTMENT_OTHER): Payer: Self-pay | Admitting: Student

## 2023-09-16 DIAGNOSIS — R569 Unspecified convulsions: Secondary | ICD-10-CM

## 2023-09-16 DIAGNOSIS — F1090 Alcohol use, unspecified, uncomplicated: Secondary | ICD-10-CM

## 2023-09-16 NOTE — Telephone Encounter (Signed)
 Called pt and told him that he needed to schedule appt w/ [facility]. He will call us  back, is at a store and cannot write down their number right now.

## 2023-09-17 NOTE — Telephone Encounter (Signed)
 Called pt and gave them number for [facility]

## 2023-09-19 ENCOUNTER — Ambulatory Visit (HOSPITAL_BASED_OUTPATIENT_CLINIC_OR_DEPARTMENT_OTHER): Payer: MEDICAID | Admitting: Student

## 2023-11-23 ENCOUNTER — Other Ambulatory Visit (HOSPITAL_BASED_OUTPATIENT_CLINIC_OR_DEPARTMENT_OTHER): Payer: Self-pay | Admitting: Student

## 2023-11-23 DIAGNOSIS — F1090 Alcohol use, unspecified, uncomplicated: Secondary | ICD-10-CM

## 2023-11-23 DIAGNOSIS — R569 Unspecified convulsions: Secondary | ICD-10-CM

## 2023-11-25 NOTE — Telephone Encounter (Signed)
 Spoke to [place] (girlfriend per [facility]). Advised he needed to call to make an appointment. We will wait for refill.

## 2023-11-27 ENCOUNTER — Other Ambulatory Visit (HOSPITAL_BASED_OUTPATIENT_CLINIC_OR_DEPARTMENT_OTHER): Payer: MEDICAID

## 2023-11-27 ENCOUNTER — Ambulatory Visit (INDEPENDENT_AMBULATORY_CARE_PROVIDER_SITE_OTHER): Payer: MEDICAID | Admitting: Student

## 2023-11-27 ENCOUNTER — Encounter (HOSPITAL_BASED_OUTPATIENT_CLINIC_OR_DEPARTMENT_OTHER): Payer: Self-pay | Admitting: Student

## 2023-11-27 VITALS — BP 125/79 | HR 61 | Temp 97.9°F | Resp 16 | Ht 69.49 in | Wt 204.0 lb

## 2023-11-27 DIAGNOSIS — D539 Nutritional anemia, unspecified: Secondary | ICD-10-CM | POA: Diagnosis not present

## 2023-11-27 DIAGNOSIS — Z5181 Encounter for therapeutic drug level monitoring: Secondary | ICD-10-CM

## 2023-11-27 DIAGNOSIS — E781 Pure hyperglyceridemia: Secondary | ICD-10-CM | POA: Diagnosis not present

## 2023-11-27 NOTE — Patient Instructions (Signed)
 It was nice to see you today!  As we discussed in clinic:  Please take the Tricor  daily for your elevated triglycerides (fat in your blood).  Please make sure to take the depakote  twice daily as directed. I need you to come back for fasting labs next week.  If you have any problems before your next visit feel free to message me via MyChart (minor issues or questions) or call the office, otherwise you may reach out to schedule an office visit.  Thank you! Kristopher Delk, PA-C

## 2023-11-27 NOTE — Progress Notes (Signed)
 Established Patient Office Visit  Subjective   Patient ID: Peter Waller, male    DOB: 01/29/1986  Age: 38 y.o. MRN: [id number]  Chief Complaint  Patient presents with   Medical Management of Chronic Issues    Follow up. Could get in to see Neuro on 01/01/2024.    HPI  Discussed the use of AI scribe software for clinical note transcription with the patient, who gave verbal consent to proceed.  History of Present Illness   Peter Waller is a 38 year old male with seizures and hypertriglyceridemia who presents for follow-up on medication management and lab results.  He has a history of seizures, initially thought to be related to alcohol  withdrawal. He has not experienced any recent seizures and has abstained from alcohol . His Depakote  levels were previously low at 45, below the therapeutic threshold of 50- he did not come back to the clinic for repeat labs as scheduled. He recently ran out of Depakote  and has been inconsistent with taking it, sometimes forgetting doses. He last took his medication yesterday or the day before, he is unsure.  He was prescribed Tricor  (fenofibrate ) for elevated triglycerides but has not been taking it. His triglycerides were noted to be high, and he is aware of the risks associated with untreated hypertriglyceridemia.  He is prediabetic and has been managing his diet by reducing carbohydrate intake. He has cut down on sweets and no longer consumes candy bars, although he continues to drink [employer]. 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  His family history includes seizures, and his mother has a history of fatty liver and emphysema attributed to smoking. He is aware of the need to make lifestyle changes to avoid similar health issues.  No recent seizures and no side effects from his medications. He does not drink water, which he acknowledges may not be beneficial to his health.      Patient Active Problem List    Diagnosis Date Noted   Current moderate episode of major depressive disorder without prior episode (HCC) 07/22/2023   Tremor 07/22/2023   Alcohol  use disorder 03/29/2022   Alcohol  abuse 03/27/2022   Seizures (HCC)    Past Medical History:  Diagnosis Date   Alcohol  abuse    Seizures (HCC)    DTs   Social History   Tobacco Use   Smoking status: Former    Current packs/day: 0.00    Average packs/day: 0.5 packs/day for 29.0 years (14.5 ttl pk-yrs)    Types: Cigarettes    Start date: 29    Quit date: 2024    Years since quitting: 1.7    Passive exposure: Current   Smokeless tobacco: Former  Building services engineer status: Every Day   Substances: Nicotine   Substance Use Topics   Alcohol  use: Not Currently    Comment: FORMER   Drug use: No    Comment: Admits using 2 days ago--I am not addicted'   Allergies  Allergen Reactions   Aloe Rash      ROS Per HPI.    Objective:     BP 125/79   Pulse 61   Temp 97.9 F (36.6 C) (Oral)   Resp 16   Ht 5' 9.49 (1.765 m)   Wt 204 lb (92.5 kg)   SpO2 95%   BMI 29.70 kg/m  BP Readings from Last 3 Encounters:  11/27/23 125/79  07/22/23 112/74  01/24/17 129/88   Wt Readings from  Last 3 Encounters:  11/27/23 204 lb (92.5 kg)  07/22/23 215 lb (97.5 kg)  12/17/15 180 lb (81.6 kg)      Physical Exam Constitutional:      General: He is not in acute distress.    Appearance: Normal appearance. He is not ill-appearing.  HENT:     Head: Normocephalic and atraumatic.     Right Ear: External ear normal.     Left Ear: External ear normal.     Nose: Nose normal.  Eyes:     Conjunctiva/sclera: Conjunctivae normal.  Cardiovascular:     Rate and Rhythm: Normal rate and regular rhythm.     Pulses: Normal pulses.     Heart sounds: Normal heart sounds. No murmur heard.    No friction rub.  Pulmonary:     Effort: Pulmonary effort is normal. No respiratory distress.     Breath sounds: Normal breath sounds. No wheezing, rhonchi  or rales.  Skin:    General: Skin is warm and dry.     Coloration: Skin is not jaundiced or pale.  Neurological:     Mental Status: He is alert.  Psychiatric:        Mood and Affect: Mood normal.        Behavior: Behavior normal.      No results found for any visits on 11/27/23.  Last CBC Lab Results  Component Value Date   WBC 7.9 07/22/2023   HGB 14.0 07/22/2023   HCT 41.2 07/22/2023   MCV 101 (H) 07/22/2023   MCH 34.1 (H) 07/22/2023   RDW 13.0 07/22/2023   PLT 175 07/22/2023   Last metabolic panel Lab Results  Component Value Date   GLUCOSE 87 07/22/2023   NA 141 07/22/2023   K 4.5 07/22/2023   CL 100 07/22/2023   CO2 23 07/22/2023   BUN 11 07/22/2023   CREATININE 1.13 07/22/2023   EGFR 85 07/22/2023   CALCIUM 9.5 07/22/2023   PROT 6.9 07/22/2023   ALBUMIN 4.6 07/22/2023   LABGLOB 2.3 07/22/2023   BILITOT 0.4 07/22/2023   ALKPHOS 45 07/22/2023   AST 18 07/22/2023   ALT 20 07/22/2023   ANIONGAP 13 03/27/2022   Last lipids Lab Results  Component Value Date   CHOL 116 07/22/2023   HDL 35 (L) 07/22/2023   LDLCALC 34 07/22/2023   TRIG 322 (H) 07/22/2023   CHOLHDL 3.3 07/22/2023   Last hemoglobin A1c Lab Results  Component Value Date   HGBA1C 5.8 (H) 07/22/2023      The ASCVD Risk score (Arnett DK, et al., 2019) failed to calculate for the following reasons:   The 2019 ASCVD risk score is only valid for ages 75 to 4    Assessment & Plan:   Assessment and Plan    Seizure disorder Seizure disorder with previously low serum Depakote  levels, likely due to inconsistent medication adherence. No recent breakthrough seizures. Alcohol -related seizures in the past, currently abstinent. - Recheck Depakote  levels in one week after consistent medication use. - Advise to monitor for seizures due to recent medication inconsistency. - Encourage use of a pill organizer to improve medication adherence. - Follow up with neurology on November 12th for further  evaluation and potential adjustment of seizure management.  Hypertriglyceridemia Chronic, not at goal due to non compliance. Hypertriglyceridemia with previously high triglyceride levels. Not currently taking prescribed fenofibrate . Discussed risks of untreated hypertriglyceridemia. - Start fenofibrate  (Tricor ) as prescribed. - Recheck cholesterol panel to assess triglyceride levels.  Prediabetes Prediabetes  managed with dietary modifications. No medication required. Advised to reduce carbohydrate intake, which he has been implementing by reducing sweets and junk food. - Continue dietary modifications to manage prediabetes.  Macrocytosis Macrocytosis with enlarged red blood cells, possibly due to vitamin deficiency. - Order vitamin B12 and folate levels to evaluate for deficiency.      Return in about 6 months (around 05/27/2024).    Sherman Donaldson T Timmy Bubeck, PA-C

## 2023-12-05 ENCOUNTER — Other Ambulatory Visit (HOSPITAL_BASED_OUTPATIENT_CLINIC_OR_DEPARTMENT_OTHER): Payer: MEDICAID

## 2024-01-01 ENCOUNTER — Ambulatory Visit: Payer: MEDICAID | Admitting: Neurology

## 2024-01-01 ENCOUNTER — Encounter: Payer: Self-pay | Admitting: Neurology

## 2024-01-01 VITALS — BP 118/76 | HR 75 | Ht 70.0 in | Wt 208.5 lb

## 2024-01-01 DIAGNOSIS — G40909 Epilepsy, unspecified, not intractable, without status epilepticus: Secondary | ICD-10-CM | POA: Diagnosis not present

## 2024-01-01 DIAGNOSIS — Z5181 Encounter for therapeutic drug level monitoring: Secondary | ICD-10-CM | POA: Diagnosis not present

## 2024-01-01 DIAGNOSIS — R569 Unspecified convulsions: Secondary | ICD-10-CM

## 2024-01-01 MED ORDER — DIVALPROEX SODIUM ER 500 MG PO TB24: 500.0000 mg | ORAL_TABLET | Freq: Two times a day (BID) | ORAL | 3 refills | Status: AC

## 2024-01-01 NOTE — Progress Notes (Signed)
 [facility]  PATIENT: Peter Waller DOB: Jun 22, 1985  REQUESTING CLINICIAN: Rothfuss, Lang DASEN, PA-C HISTORY FROM: Patient and spouse REASON FOR VISIT: Establish care for seizures   HISTORICAL  CHIEF COMPLAINT:  Chief Complaint  Patient presents with   RM 12    Patient is here with wife for seizure and medication management - no seizure or symptoms since starting medications     HISTORY OF PRESENT ILLNESS:  This is a 38 year old gentleman past medical history of alcohol  abuse, in remission, anxiety/depression, seizure disorder who is presenting to establish care.  Patient tells me his seizures started around 8 years ago.  At that time he was also drinking very heavily.  Seizure described as generalized convulsion, with loss of consciousness.  He denies any injury with his seizures, no tongue biting or urinary incontinence.  He report 2 years ago he was put on Depakote  and since being compliant with the medication he has not had any additional seizures.  Denies any side effect from the Depakote .  Denies any seizure risk factor including history of head trauma, no history of brain infection or autoimmune disease.  He does have a history of alcohol  abuse and family members on his father side with seizures.    Handedness: Left handed   Onset: 2018  Seizure Type: Generalized convulsion   Current frequency: Last seizure 2 years ago   Any injuries from seizures: Denies  Seizure risk factors: Paternal side of the family   Previous ASMs: Depakote   Currenty ASMs: Depakote  500 mg twice daily  ASMs side effects: Denies  Brain Images: Not available for review  Previous EEGs: Not available for review   OTHER MEDICAL CONDITIONS: Anxiety/Depression, history of heavy alcohol  use, seizures  REVIEW OF SYSTEMS: Full 14 system review of systems performed and negative with exception of: As noted in the HPI  ALLERGIES: Allergies  Allergen Reactions   Aloe Rash     HOME MEDICATIONS: Outpatient Medications Prior to Visit  Medication Sig Dispense Refill   cloNIDine  (CATAPRES ) 0.1 MG tablet Take 1 tablet (0.1 mg total) by mouth 2 (two) times daily. 60 tablet 6   fenofibrate  (TRICOR ) 145 MG tablet Take 1 tablet (145 mg total) by mouth daily. 90 tablet 3   hydrOXYzine  (ATARAX ) 25 MG tablet Take 25 mg by mouth as needed for anxiety.     sertraline  (ZOLOFT ) 50 MG tablet Take 50 mg by mouth daily.     traZODone  (DESYREL ) 100 MG tablet Take 100 mg by mouth at bedtime as needed.     divalproex  (DEPAKOTE  ER) 500 MG 24 hr tablet TAKE 1 TABLET (500 MG TOTAL) BY MOUTH IN THE MORNING AND AT BEDTIME 60 tablet 1   naltrexone  (DEPADE) 50 MG tablet Take 1 tablet (50 mg total) by mouth daily. (Patient not taking: Reported on 01/01/2024) 30 tablet 0   No facility-administered medications prior to visit.    PAST MEDICAL HISTORY: Past Medical History:  Diagnosis Date   Alcohol  abuse    Seizures (HCC)    DTs    PAST SURGICAL HISTORY: Past Surgical History:  Procedure Laterality Date   APPENDECTOMY     EAR TUBE REMOVAL     Left ear growth in inner ear x6     TYMPANOSTOMY TUBE PLACEMENT      FAMILY HISTORY: Family History  Problem Relation Age of Onset   Depression Mother    COPD Mother    Depression Father     SOCIAL HISTORY: Social History  Socioeconomic History   Marital status: Married    Spouse name: Not on file   Number of children: 5   Years of education: Not on file   Highest education level: Not on file  Occupational History    Employer: [employer]  Tobacco Use   Smoking status: Former    Current packs/day: 0.00    Average packs/day: 0.5 packs/day for 29.0 years (14.5 ttl pk-yrs)    Types: Cigarettes    Start date: 72    Quit date: 2024    Years since quitting: 1.8    Passive exposure: Current   Smokeless tobacco: Former  Building Services Engineer status: Every Day   Substances: Nicotine   Substance and Sexual Activity    Alcohol  use: Not Currently    Comment: FORMER   Drug use: No    Comment: Admits using 2 days ago--I am not addicted'   Sexual activity: Yes  Other Topics Concern   Not on file  Social History Narrative   1 child & 4 step kids      1-2 red bulls a day and a pot of coffee daily    Social Drivers of Corporate Investment Banker Strain: Not on file  Food Insecurity: No Food Insecurity (07/22/2023)   Hunger Vital Sign    Worried About Running Out of Food in the Last Year: Never true    Ran Out of Food in the Last Year: Never true  Transportation Needs: No Transportation Needs (07/22/2023)   PRAPARE - Administrator, Civil Service (Medical): No    Lack of Transportation (Non-Medical): No  Physical Activity: Not on file  Stress: Not on file  Social Connections: Not on file  Intimate Partner Violence: Not At Risk (07/22/2023)   Humiliation, Afraid, Rape, and Kick questionnaire    Fear of Current or Ex-Partner: No    Emotionally Abused: No    Physically Abused: No    Sexually Abused: No    PHYSICAL EXAM  GENERAL EXAM/CONSTITUTIONAL: Vitals:  Vitals:   01/01/24 1313  BP: 118/76  Pulse: 75  SpO2: 98%  Weight: 208 lb 8 oz (94.6 kg)  Height: 5' 10 (1.778 m)   Body mass index is 29.92 kg/m. Wt Readings from Last 3 Encounters:  01/01/24 208 lb 8 oz (94.6 kg)  11/27/23 204 lb (92.5 kg)  07/22/23 215 lb (97.5 kg)   Patient is in no distress; well developed, nourished and groomed; neck is supple  MUSCULOSKELETAL: Gait, strength, tone, movements noted in Neurologic exam below  NEUROLOGIC: MENTAL STATUS:      No data to display         awake, alert, oriented to person, place and time recent and remote memory intact normal attention and concentration language fluent, comprehension intact, naming intact fund of knowledge appropriate  CRANIAL NERVE:  2nd, 3rd, 4th, 6th - Visual fields full to confrontation, extraocular muscles intact, no nystagmus 5th - facial  sensation symmetric 7th - facial strength symmetric 8th - hearing intact 9th - palate elevates symmetrically, uvula midline 11th - shoulder shrug symmetric 12th - tongue protrusion midline  MOTOR:  normal bulk and tone, full strength in the BUE, BLE  SENSORY:  normal and symmetric to light touch  COORDINATION:  finger-nose-finger, fine finger movements normal  GAIT/STATION:  normal   DIAGNOSTIC DATA (LABS, IMAGING, TESTING) - I reviewed patient records, labs, notes, testing and imaging myself where available.  Lab Results  Component Value Date   WBC  7.9 07/22/2023   HGB 14.0 07/22/2023   HCT 41.2 07/22/2023   MCV 101 (H) 07/22/2023   PLT 175 07/22/2023      Component Value Date/Time   NA 141 07/22/2023 1535   K 4.5 07/22/2023 1535   CL 100 07/22/2023 1535   CO2 23 07/22/2023 1535   GLUCOSE 87 07/22/2023 1535   GLUCOSE 79 03/27/2022 1653   BUN 11 07/22/2023 1535   CREATININE 1.13 07/22/2023 1535   CALCIUM 9.5 07/22/2023 1535   PROT 6.9 07/22/2023 1535   ALBUMIN 4.6 07/22/2023 1535   AST 18 07/22/2023 1535   ALT 20 07/22/2023 1535   ALKPHOS 45 07/22/2023 1535   BILITOT 0.4 07/22/2023 1535   GFRNONAA >60 03/27/2022 1653   GFRAA >60 01/24/2017 1050   Lab Results  Component Value Date   CHOL 116 07/22/2023   HDL 35 (L) 07/22/2023   LDLCALC 34 07/22/2023   TRIG 322 (H) 07/22/2023   Lab Results  Component Value Date   HGBA1C 5.8 (H) 07/22/2023   No results found for: VITAMINB12 Lab Results  Component Value Date   TSH 1.427 03/27/2022     ASSESSMENT AND PLAN  38 y.o. year old male  with history of seizure disorder, alcohol  use in remission, anxiety/depression who presented for management of his seizures.  He has been seizure-free for the past 2 years on Depakote  500 mg twice daily. Plan will be to continue him on Depakote  500 mg twice daily and if he remains seizure-free for at least 5 years we can reduce the medication with plan to discontinue it in  the future.  He was understanding.  I will obtain some labs today and I will see him in a year or sooner if worse   1. Seizure disorder (HCC)   2. Seizures (HCC)   3. Therapeutic drug monitoring     Patient Instructions  Continue with Depakote  500 mg twice daily, Refill given Will obtain Depakote  level, CMP, CBC, and vitamin D level Follow-up in 1 year or sooner if worse Please contact me for any additional question or concerns   Per [place] statutes, patients with seizures are not allowed to drive until they have been seizure-free for six months.  Other recommendations include using caution when using heavy equipment or power tools. Avoid working on ladders or at heights. Take showers instead of baths.  Do not swim alone.  Ensure the water temperature is not too high on the home water heater. Do not go swimming alone. Do not lock yourself in a room alone (i.e. bathroom). When caring for infants or small children, sit down when holding, feeding, or changing them to minimize risk of injury to the child in the event you have a seizure. Maintain good sleep hygiene. Avoid alcohol .  Also recommend adequate sleep, hydration, good diet and minimize stress.   During the Seizure  - First, ensure adequate ventilation and place patients on the floor on their left side  Loosen clothing around the neck and ensure the airway is patent. If the patient is clenching the teeth, do not force the mouth open with any object as this can cause severe damage - Remove all items from the surrounding that can be hazardous. The patient may be oblivious to what's happening and may not even know what he or she is doing. If the patient is confused and wandering, either gently guide him/her away and block access to outside areas - Reassure the individual and be comforting -  Call 911. In most cases, the seizure ends before EMS arrives. However, there are cases when seizures may last over 3 to 5 minutes. Or the  individual may have developed breathing difficulties or severe injuries. If a pregnant patient or a person with diabetes develops a seizure, it is prudent to call an ambulance. - Finally, if the patient does not regain full consciousness, then call EMS. Most patients will remain confused for about 45 to 90 minutes after a seizure, so you must use judgment in calling for help. - Avoid restraints but make sure the patient is in a bed with padded side rails - Place the individual in a lateral position with the neck slightly flexed; this will help the saliva drain from the mouth and prevent the tongue from falling backward - Remove all nearby furniture and other hazards from the area - Provide verbal assurance as the individual is regaining consciousness - Provide the patient with privacy if possible - Call for help and start treatment as ordered by the caregiver   After the Seizure (Postictal Stage)  After a seizure, most patients experience confusion, fatigue, muscle pain and/or a headache. Thus, one should permit the individual to sleep. For the next few days, reassurance is essential. Being calm and helping reorient the person is also of importance.  Most seizures are painless and end spontaneously. Seizures are not harmful to others but can lead to complications such as stress on the lungs, brain and the heart. Individuals with prior lung problems may develop labored breathing and respiratory distress.    Discussed Patients with epilepsy have a small risk of sudden unexpected death, a condition referred to as sudden unexpected death in epilepsy (SUDEP). SUDEP is defined specifically as the sudden, unexpected, witnessed or unwitnessed, nontraumatic and nondrowning death in patients with epilepsy with or without evidence for a seizure, and excluding documented status epilepticus, in which post mortem examination does not reveal a structural or toxicologic cause for death     Orders Placed This  Encounter  Procedures   CMP   CBC (no diff)   Vitamin D, 25-hydroxy    Meds ordered this encounter  Medications   divalproex  (DEPAKOTE  ER) 500 MG 24 hr tablet    Sig: Take 1 tablet (500 mg total) by mouth in the morning and at bedtime.    Dispense:  180 tablet    Refill:  3    Return in about 1 year (around 12/31/2024).    Pastor Falling, MD 01/01/2024, 1:51 PM  [facility] [address] [place], [place] [address] [phone]

## 2024-01-01 NOTE — Patient Instructions (Signed)
 Continue with Depakote  500 mg twice daily, Refill given Will obtain Depakote  level, CMP, CBC, and vitamin D level Follow-up in 1 year or sooner if worse Please contact me for any additional question or concerns

## 2024-01-02 ENCOUNTER — Ambulatory Visit: Payer: Self-pay | Admitting: Neurology

## 2024-01-02 LAB — COMPREHENSIVE METABOLIC PANEL WITH GFR
ALT: 16 IU/L (ref 0–44)
AST: 18 IU/L (ref 0–40)
Albumin: 4.6 g/dL (ref 4.1–5.1)
Alkaline Phosphatase: 32 IU/L — ABNORMAL LOW (ref 47–123)
BUN/Creatinine Ratio: 14 (ref 9–20)
BUN: 19 mg/dL (ref 6–20)
Bilirubin Total: 0.3 mg/dL (ref 0.0–1.2)
CO2: 26 mmol/L (ref 20–29)
Calcium: 9.6 mg/dL (ref 8.7–10.2)
Chloride: 102 mmol/L (ref 96–106)
Creatinine, Ser: 1.38 mg/dL — ABNORMAL HIGH (ref 0.76–1.27)
Globulin, Total: 2.2 g/dL (ref 1.5–4.5)
Glucose: 109 mg/dL — ABNORMAL HIGH (ref 70–99)
Potassium: 4 mmol/L (ref 3.5–5.2)
Sodium: 141 mmol/L (ref 134–144)
Total Protein: 6.8 g/dL (ref 6.0–8.5)
eGFR: 67 mL/min/1.73 (ref >59)

## 2024-01-02 LAB — CBC
Hematocrit: 40.1 % (ref 37.5–51.0)
Hemoglobin: 13.5 g/dL (ref 13.0–17.7)
MCH: 33.6 pg — ABNORMAL HIGH (ref 26.6–33.0)
MCHC: 33.7 g/dL (ref 31.5–35.7)
MCV: 100 fL — ABNORMAL HIGH (ref 79–97)
Platelets: 227 x10E3/uL (ref 150–450)
RBC: 4.02 x10E6/uL — ABNORMAL LOW (ref 4.14–5.80)
RDW: 12.7 % (ref 11.6–15.4)
WBC: 7.4 x10E3/uL (ref 3.4–10.8)

## 2024-01-02 LAB — VITAMIN D 25 HYDROXY (VIT D DEFICIENCY, FRACTURES): Vit D, 25-Hydroxy: 14.8 ng/mL — ABNORMAL LOW (ref 30.0–100.0)

## 2024-01-02 MED ORDER — VITAMIN D (ERGOCALCIFEROL) 1.25 MG (50000 UNIT) PO CAPS: 50000.0000 [IU] | ORAL_CAPSULE | ORAL | 0 refills | Status: AC

## 2024-01-02 NOTE — Progress Notes (Signed)
 Please call and advise the patient that the recent labs we checked showed a low Vitamin D level. Please inform patient that I will start her on Vitamin D supplement to take once a week for the next 12 weeks. Please remind patient to keep any upcoming appointments or tests and to call us  with any interim questions, concerns, problems or updates. Thanks,   Pastor Falling, MD

## 2024-02-21 ENCOUNTER — Encounter: Payer: Self-pay | Admitting: Primary Care

## 2024-05-27 ENCOUNTER — Ambulatory Visit (HOSPITAL_BASED_OUTPATIENT_CLINIC_OR_DEPARTMENT_OTHER): Payer: MEDICAID | Admitting: Student

## 2025-01-05 ENCOUNTER — Ambulatory Visit: Payer: MEDICAID | Admitting: Adult Health
